# Patient Record
Sex: Male | Born: 1935 | Race: White | Hispanic: No | Marital: Married | Smoking: Former smoker
Health system: Southern US, Community
[De-identification: ages and names within clinical notes are randomized; demographics above are authoritative.]

## PROBLEM LIST (undated history)

## (undated) DIAGNOSIS — I429 Cardiomyopathy, unspecified: Secondary | ICD-10-CM

## (undated) DIAGNOSIS — I4729 Other ventricular tachycardia: Secondary | ICD-10-CM

## (undated) DIAGNOSIS — I10 Essential (primary) hypertension: Secondary | ICD-10-CM

## (undated) DIAGNOSIS — I472 Ventricular tachycardia, unspecified: Secondary | ICD-10-CM

## (undated) DIAGNOSIS — N400 Enlarged prostate without lower urinary tract symptoms: Secondary | ICD-10-CM

## (undated) DIAGNOSIS — M1990 Unspecified osteoarthritis, unspecified site: Secondary | ICD-10-CM

## (undated) DIAGNOSIS — I739 Peripheral vascular disease, unspecified: Secondary | ICD-10-CM

## (undated) HISTORY — PX: PACEMAKER PLACEMENT: SHX43

## (undated) HISTORY — PX: CARDIAC SURGERY: SHX584

---

## 2009-07-17 ENCOUNTER — Ambulatory Visit: Payer: Self-pay | Admitting: Cardiovascular Disease

## 2009-07-18 ENCOUNTER — Inpatient Hospital Stay: Payer: Self-pay | Admitting: Internal Medicine

## 2011-09-10 LAB — BASIC METABOLIC PANEL
Anion Gap: 5 — ABNORMAL LOW (ref 7–16)
BUN: 20 mg/dL — ABNORMAL HIGH (ref 7–18)
Chloride: 108 mmol/L — ABNORMAL HIGH (ref 98–107)
Co2: 29 mmol/L (ref 21–32)
Creatinine: 1.34 mg/dL — ABNORMAL HIGH (ref 0.60–1.30)
EGFR (African American): 57 — ABNORMAL LOW
EGFR (Non-African Amer.): 49 — ABNORMAL LOW
Glucose: 96 mg/dL (ref 65–99)
Potassium: 4.4 mmol/L (ref 3.5–5.1)
Sodium: 142 mmol/L (ref 136–145)

## 2011-09-10 LAB — PROTIME-INR
INR: 1.1
Prothrombin Time: 14.7 secs (ref 11.5–14.7)

## 2011-09-10 LAB — CBC
HCT: 36.7 % — ABNORMAL LOW (ref 40.0–52.0)
MCH: 35.4 pg — ABNORMAL HIGH (ref 26.0–34.0)
MCHC: 34.6 g/dL (ref 32.0–36.0)
MCV: 102 fL — ABNORMAL HIGH (ref 80–100)
Platelet: 238 10*3/uL (ref 150–440)
RBC: 3.58 10*6/uL — ABNORMAL LOW (ref 4.40–5.90)
WBC: 6.8 10*3/uL (ref 3.8–10.6)

## 2011-09-10 LAB — HEPATIC FUNCTION PANEL A (ARMC)
Bilirubin, Direct: <0.1 mg/dL (ref 0.00–0.20)
Bilirubin,Total: 0.4 mg/dL (ref 0.2–1.0)
SGOT(AST): 23 U/L (ref 15–37)

## 2011-09-10 LAB — APTT: Activated PTT: 35.6 secs (ref 23.6–35.9)

## 2011-09-10 LAB — CK TOTAL AND CKMB (NOT AT ARMC): CK-MB: 1.2 ng/mL (ref 0.5–3.6)

## 2011-09-11 ENCOUNTER — Observation Stay: Payer: Self-pay | Admitting: Internal Medicine

## 2011-09-11 LAB — CBC WITH DIFFERENTIAL/PLATELET
Basophil %: 0.5 %
Eosinophil %: 5.8 %
HCT: 32.5 % — ABNORMAL LOW (ref 40.0–52.0)
HGB: 10.9 g/dL — ABNORMAL LOW (ref 13.0–18.0)
Lymphocyte #: 2.2 10*3/uL (ref 1.0–3.6)
MCH: 32.9 pg (ref 26.0–34.0)
MCHC: 33.5 g/dL (ref 32.0–36.0)
MCV: 98 fL (ref 80–100)
Monocyte #: 0.6 x10 3/mm (ref 0.2–1.0)
Monocyte %: 9 %
Neutrophil %: 48.9 %
Platelet: 209 10*3/uL (ref 150–440)
RDW: 13 % (ref 11.5–14.5)
WBC: 6.2 10*3/uL (ref 3.8–10.6)

## 2011-09-11 LAB — URINALYSIS, COMPLETE
Bilirubin,UR: NEGATIVE
Glucose,UR: NEGATIVE mg/dL (ref 0–75)
Ketone: NEGATIVE
Nitrite: NEGATIVE
Protein: NEGATIVE
RBC,UR: 2 /HPF (ref 0–5)
Specific Gravity: 1.013 (ref 1.003–1.030)

## 2011-09-11 LAB — BASIC METABOLIC PANEL
Anion Gap: 4 — ABNORMAL LOW (ref 7–16)
BUN: 19 mg/dL — ABNORMAL HIGH (ref 7–18)
Chloride: 108 mmol/L — ABNORMAL HIGH (ref 98–107)
Co2: 29 mmol/L (ref 21–32)
Creatinine: 1.35 mg/dL — ABNORMAL HIGH (ref 0.60–1.30)
EGFR (African American): 56 — ABNORMAL LOW
EGFR (Non-African Amer.): 49 — ABNORMAL LOW
Osmolality: 286 (ref 275–301)
Potassium: 3.8 mmol/L (ref 3.5–5.1)
Sodium: 141 mmol/L (ref 136–145)

## 2011-09-11 LAB — CK TOTAL AND CKMB (NOT AT ARMC)
CK, Total: 47 U/L (ref 35–232)
CK, Total: 45 U/L (ref 35–232)
CK-MB: 1.1 ng/mL (ref 0.5–3.6)

## 2011-09-11 LAB — LIPID PANEL
Cholesterol: 152 mg/dL (ref 0–200)
HDL Cholesterol: 48 mg/dL (ref 40–60)

## 2011-09-11 LAB — APTT: Activated PTT: 127.2 secs — ABNORMAL HIGH (ref 23.6–35.9)

## 2011-09-11 LAB — TROPONIN I
Troponin-I: <0.02 ng/mL
Troponin-I: <0.02 ng/mL

## 2013-03-14 ENCOUNTER — Emergency Department: Payer: Self-pay | Admitting: Emergency Medicine

## 2013-03-14 LAB — CBC
MCH: 33.3 pg (ref 26.0–34.0)
RDW: 12.8 % (ref 11.5–14.5)
WBC: 8.1 10*3/uL (ref 3.8–10.6)

## 2013-03-14 LAB — BASIC METABOLIC PANEL
Anion Gap: 5 — ABNORMAL LOW (ref 7–16)
BUN: 35 mg/dL — ABNORMAL HIGH (ref 7–18)
Calcium, Total: 10 mg/dL (ref 8.5–10.1)
Co2: 29 mmol/L (ref 21–32)
Creatinine: 1.74 mg/dL — ABNORMAL HIGH (ref 0.60–1.30)
EGFR (African American): 41 — ABNORMAL LOW
EGFR (Non-African Amer.): 35 — ABNORMAL LOW

## 2013-03-14 LAB — PROTIME-INR: INR: 2.5

## 2013-03-14 LAB — TROPONIN I: Troponin-I: <0.02 ng/mL

## 2014-03-17 ENCOUNTER — Emergency Department: Payer: Self-pay | Admitting: Internal Medicine

## 2014-03-17 LAB — COMPREHENSIVE METABOLIC PANEL
ALBUMIN: 3.7 g/dL (ref 3.4–5.0)
ALT: 24 U/L
AST: 32 U/L (ref 15–37)
Alkaline Phosphatase: 81 U/L
Anion Gap: 5 — ABNORMAL LOW (ref 7–16)
BILIRUBIN TOTAL: 0.3 mg/dL (ref 0.2–1.0)
BUN: 35 mg/dL — ABNORMAL HIGH (ref 7–18)
CHLORIDE: 104 mmol/L (ref 98–107)
Calcium, Total: 9.5 mg/dL (ref 8.5–10.1)
Co2: 32 mmol/L (ref 21–32)
Creatinine: 1.54 mg/dL — ABNORMAL HIGH (ref 0.60–1.30)
EGFR (African American): 56 — ABNORMAL LOW
GFR CALC NON AF AMER: 46 — AB
GLUCOSE: 129 mg/dL — AB (ref 65–99)
Osmolality: 291 (ref 275–301)
POTASSIUM: 4.1 mmol/L (ref 3.5–5.1)
SODIUM: 141 mmol/L (ref 136–145)
Total Protein: 7.1 g/dL (ref 6.4–8.2)

## 2014-03-17 LAB — LIPASE, BLOOD: Lipase: 122 U/L (ref 73–393)

## 2014-03-17 LAB — CBC WITH DIFFERENTIAL/PLATELET
BASOS ABS: 0 10*3/uL (ref 0.0–0.1)
BASOS PCT: 0.5 %
EOS PCT: 5.1 %
Eosinophil #: 0.5 10*3/uL (ref 0.0–0.7)
HCT: 37.3 % — ABNORMAL LOW (ref 40.0–52.0)
HGB: 12.2 g/dL — ABNORMAL LOW (ref 13.0–18.0)
LYMPHS PCT: 26.2 %
Lymphocyte #: 2.6 10*3/uL (ref 1.0–3.6)
MCH: 32.8 pg (ref 26.0–34.0)
MCHC: 32.6 g/dL (ref 32.0–36.0)
MCV: 101 fL — ABNORMAL HIGH (ref 80–100)
MONO ABS: 0.9 x10 3/mm (ref 0.2–1.0)
MONOS PCT: 9.4 %
NEUTROS ABS: 5.7 10*3/uL (ref 1.4–6.5)
Neutrophil %: 58.8 %
Platelet: 218 10*3/uL (ref 150–440)
RBC: 3.71 10*6/uL — ABNORMAL LOW (ref 4.40–5.90)
RDW: 13 % (ref 11.5–14.5)
WBC: 9.7 10*3/uL (ref 3.8–10.6)

## 2014-07-02 ENCOUNTER — Emergency Department: Payer: Self-pay | Admitting: Emergency Medicine
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
## 2014-08-28 NOTE — Consult Note (Signed)
PATIENT NAME:  Daniel Huff, HOLZMAN MR#:  [id number] DATE OF BIRTH:  April 02, 1927  DATE OF CONSULTATION:  09/10/2011  REFERRING PHYSICIAN:  Dr. Inez Catalina  CONSULTING PHYSICIAN:  Corey Skains, MD  REASON FOR CONSULTATION: Aortic valve stenosis status post valvular replacement, hypertension, hyperlipidemia, coronary artery disease, abnormal EKG with chest pain. He is complaining of chest pain.   HISTORY OF PRESENT ILLNESS: This is an 81 year old male with known aortic valve disease, coronary artery disease status post aortic valve replacement twice, the second time he had mechanical St. Jude valve replacement at that time. Things have gone fairly well without evidence of significant new problems with his valve. The patient has had hospitalization for chest discomfort but no evidence of myocardial infarction. The patient now has atypical left upper chest discomfort radiating into his back not associated with shortness of breath. EKG shows normal sinus rhythm, left bundle branch block. This is unchanged from before. Troponin, CK-MB are within normal limits. The patient also has had no current evidence of congestive heart failure. The patient has had anticoagulation in the past but recently had a GI bleed and significant problems with compliance with his medications and therefore has had some difficulty taking his medications therefore no anticoagulation has been used.   REVIEW OF SYSTEMS: Remainder of review of systems cannot be assessed due to difficulty and dementia.   PAST MEDICAL HISTORY:  1. Aortic valve stenosis status post valve replacement.  2. Hypertension.  3. Hyperlipidemia.  4. Coronary artery disease.   FAMILY HISTORY: No family members with early onset of cardiovascular disease.   SOCIAL HISTORY: Currently denies alcohol or tobacco use.   ALLERGIES: He has no known drug allergies.   CURRENT MEDICATIONS: As listed.   PHYSICAL EXAMINATION:  VITAL SIGNS: Blood pressure 126/68 bilateral,  heart rate 72 upright, reclining, and regular.   GENERAL: He is a well appearing male in no acute distress.   HEENT: No icterus, thyromegaly, ulcers, hemorrhage, or xanthelasma.   CARDIOVASCULAR: Regular rate and rhythm with normal S1, normal prosthetic click with a 2 to 3/6 right upper extremity border murmur and radiating throughout. Point of maximal impulse is diffuse. Carotid upstroke normal without bruit. Jugular venous pressure normal.   LUNGS: Lungs have few basilar crackles with normal respirations.   ABDOMEN: Soft, nontender, without hepatosplenomegaly or masses. Abdominal aorta is normal size without bruit.   EXTREMITIES: 2+ radial, femoral, dorsal pedal pulses with no lower extremity edema, cyanosis, clubbing, ulcers.   NEUROLOGIC: He is oriented to time, place, and person with normal mood and affect.   ASSESSMENT: 81 year old male with aortic valve stenosis, hypertension, hyperlipidemia, coronary artery disease with atypical chest discomfort without evidence of acute myocardial infarction or elevated troponin but abnormal EKG, stable at this time.   RECOMMENDATIONS:  1. Continue serial ECG and enzymes to assess for possible myocardial infarction.  2. Continue hypertension control with current medical regimen.  3. No anticoagulation at this time due to significant previous GI bleed and further concerns of compliance and fall risk as well as dementia.  4. Ambulate and follow for any further significant symptoms.  5. No further cardiac diagnostics necessary at this time.   ____________________________ Corey Skains, MD bjk:cms D: 09/15/2011 07:42:20 ET T: 09/16/2011 08:35:47 ET  JOB#: [id number] cc: Corey Skains, MD, <Dictator> Corey Skains MD ELECTRONICALLY SIGNED 09/19/2011 8:50

## 2014-08-28 NOTE — Discharge Summary (Signed)
PATIENT NAME:  Daniel Huff, Daniel Huff MR#:  [id number] DATE OF BIRTH:  January 24, 1927  DATE OF ADMISSION:  09/11/2011 DATE OF DISCHARGE:  09/13/2011  ADMITTING PHYSICIAN: Daniel Ligas, MD   DISCHARGING PHYSICIAN: Daniel Lighter, MD   PRIMARY CARE MD: [place]: Cardiology consultation by Dr. Serafina Huff   DISCHARGE DIAGNOSES:  1. Chest pain, likely noncardiac, may be musculoskeletal. Cardiac enzymes negative.  2. Coronary artery disease, status post bypass graft surgery.  3. Ischemic cardiomyopathy.  4. Congestive heart failure with systolic dysfunction, EF of 20%.  5. Aortic valve replacement with mechanical valve status post GI bleed history, currently off of Coumadin.  6. Chronic anemia.  7. Hypertension.  8. Mild dementia.  9. Acute renal failure on admission, resolving.  10. Benign prostatic hypertrophy.   DISCHARGE HOME MEDICATIONS:   1. Finasteride 5 mg p.o. daily.  2. Cyanocobalamin 2000 mcg p.o. daily.  3. Allopurinol 100 mg p.o. daily.  4. Cholecalciferol 2000 international units daily. 5. Simvastatin 10 mg p.o. daily.  6. Metoprolol 25 mg p.o. daily.  7. Sodium chloride 5% ophthalmic ointment to right eye twice daily.  8. Flomax 0.4 mg p.o. daily.  9. Imdur 30 mg p.o. daily.  10. Lisinopril 2.5 mg p.o. daily.   DISCHARGE DIET: Low sodium diet.   DISCHARGE ACTIVITY: As tolerated.    FOLLOW-UP INSTRUCTIONS:  1. Follow-up with primary cardiologist at [facility] in 1 to 2 weeks.  2. PCP follow-up at [facility] in one week.  3. Physical therapy.  4. PPD has been placed on right forearm on 09/13/2011 which needs to be read 48 to 72 hours from now.  LABS AND IMAGING STUDIES: WBC 6.2, hemoglobin 10.9, hematocrit 32.5, platelet count 209. Urinalysis with 2+ leukocyte esterase, few WBCs but no bacteria. Cardiac enzymes remained negative.   Echo Doppler showing severely reduced LV ejection fraction 20%, moderate mitral regurgitation, and stable  appearing AV prosthesis is present. There is severe hypokinesis and akinesis of left ventricular septum inferior and anterior wall.   Sodium 141, potassium 3.8, chloride 108, bicarb 29, BUN 19, creatinine 1.3, glucose 146, calcium 8.4. LDL 94, HDL 48, triglycerides 48, total cholesterol 152. Chest x-ray on admission showing no acute cardiopulmonary disease. Vitamin B12 is 377.   BRIEF HOSPITAL COURSE: [name] is an 81 year old gentleman with past medical history significant for coronary artery disease status post bypass graft surgery, mechanical aortic valve replacement, hypertension, and benign prostatic hypertrophy who has been living at home with poor social situation and poor family support who was brought to the ED for chest pain. The patient says that he has had chest pain intermittently several times. It could be either muscular or reflux problem but probably not cardiac related. No family was ready to take the patient home so case managers have been working with the patient.  1. Chest pain, likely noncardiac in nature. He was initially admitted for unstable angina and was monitored on telemetry under observation. He was ruled out. No further tests were performed. Echo showed akinesis and hypokinesis of inferior wall septum and anterior wall which is probably chronic. The patient has requested to be transferred to [facility] but the medical center was on diversion. He was not having any further chest pain so medical management was recommended while in the hospital. He is being discharged on metoprolol, lisinopril, and also Imdur. The patient is allergic to aspirin.  2. Coronary artery disease status post bypass graft surgery. As mentioned above,  he is on medical management currently, probably might benefit from stress test as an outpatient. He was on heparin drip for 48 hours while in the hospital and is currently chest pain free. 3. Mechanical aortic valve replacement. He was initially started on  heparin drip but it seems like he was taken off of Coumadin because of GI bleed history recently. The current echo does not show any wall thrombosis and Cardiology has seen the patient and did not recommend any further anticoagulation. This was explained to the patient and he is supposed to follow-up with his primary cardiologist at [facility].  4. Acute renal failure likely from volume depletion, prerenal in nature. He was given gentle IV hydration, however, because of his CHF more fluid was not given.  5. Mild cognitive impairment, early dementia. He cannot go back home so he will be discharged to an assisted living facility.  6. Benign prostatic hypertrophy. He is on finasteride and Flomax.   His course has been otherwise uneventful in the hospital.   DISCHARGE CONDITION: Stable.   DISCHARGE DISPOSITION: Assisted living facility.   TIME SPENT ON DISCHARGE: 40 minutes.   ____________________________ Daniel Lighter, MD rk:drc D: 09/13/2011 14:18:35 ET T: 09/16/2011 10:31:42 ET JOB#: [id number]  cc: Daniel Lighter, MD, <Dictator> [place] MD ELECTRONICALLY SIGNED 09/18/2011 14:21

## 2014-12-26 ENCOUNTER — Emergency Department: Payer: Medicare Other

## 2014-12-26 ENCOUNTER — Encounter: Payer: Self-pay | Admitting: Emergency Medicine

## 2014-12-26 ENCOUNTER — Emergency Department
Admission: EM | Admit: 2014-12-26 | Discharge: 2014-12-27 | Disposition: A | Discharge status: Other Acute Inpatient Hospital | Payer: Medicare Other | Attending: Emergency Medicine | Admitting: Emergency Medicine

## 2014-12-26 DIAGNOSIS — W1839XA Other fall on same level, initial encounter: Secondary | ICD-10-CM | POA: Diagnosis not present

## 2014-12-26 DIAGNOSIS — I10 Essential (primary) hypertension: Secondary | ICD-10-CM | POA: Diagnosis not present

## 2014-12-26 DIAGNOSIS — Y92009 Unspecified place in unspecified non-institutional (private) residence as the place of occurrence of the external cause: Secondary | ICD-10-CM | POA: Diagnosis not present

## 2014-12-26 DIAGNOSIS — S329XXA Fracture of unspecified parts of lumbosacral spine and pelvis, initial encounter for closed fracture: Secondary | ICD-10-CM

## 2014-12-26 DIAGNOSIS — Y9389 Activity, other specified: Secondary | ICD-10-CM | POA: Insufficient documentation

## 2014-12-26 DIAGNOSIS — S4991XA Unspecified injury of right shoulder and upper arm, initial encounter: Secondary | ICD-10-CM | POA: Diagnosis not present

## 2014-12-26 DIAGNOSIS — S3289XA Fracture of other parts of pelvis, initial encounter for closed fracture: Secondary | ICD-10-CM | POA: Diagnosis not present

## 2014-12-26 DIAGNOSIS — Y998 Other external cause status: Secondary | ICD-10-CM | POA: Diagnosis not present

## 2014-12-26 DIAGNOSIS — Z87891 Personal history of nicotine dependence: Secondary | ICD-10-CM | POA: Insufficient documentation

## 2014-12-26 DIAGNOSIS — S299XXA Unspecified injury of thorax, initial encounter: Secondary | ICD-10-CM | POA: Insufficient documentation

## 2014-12-26 DIAGNOSIS — S79911A Unspecified injury of right hip, initial encounter: Secondary | ICD-10-CM | POA: Diagnosis present

## 2014-12-26 HISTORY — DX: Peripheral vascular disease, unspecified: I73.9

## 2014-12-26 HISTORY — DX: Benign prostatic hyperplasia without lower urinary tract symptoms: N40.0

## 2014-12-26 HISTORY — DX: Other ventricular tachycardia: I47.29

## 2014-12-26 HISTORY — DX: Ventricular tachycardia: I47.2

## 2014-12-26 HISTORY — DX: Essential (primary) hypertension: I10

## 2014-12-26 HISTORY — DX: Ventricular tachycardia, unspecified: I47.20

## 2014-12-26 HISTORY — DX: Unspecified osteoarthritis, unspecified site: M19.90

## 2014-12-26 HISTORY — DX: Cardiomyopathy, unspecified: I42.9

## 2014-12-26 LAB — CBC WITH DIFFERENTIAL/PLATELET
Basophils Absolute: 0 10*3/uL (ref 0–0.1)
Basophils Relative: 0 %
Eosinophils Absolute: 0.2 10*3/uL (ref 0–0.7)
Eosinophils Relative: 1 %
HEMATOCRIT: 38.8 % — AB (ref 40.0–52.0)
HEMOGLOBIN: 12.6 g/dL — AB (ref 13.0–18.0)
LYMPHS ABS: 1.7 10*3/uL (ref 1.0–3.6)
LYMPHS PCT: 10 %
MCH: 31.6 pg (ref 26.0–34.0)
MCHC: 32.5 g/dL (ref 32.0–36.0)
MCV: 97.2 fL (ref 80.0–100.0)
Monocytes Absolute: 1.1 10*3/uL — ABNORMAL HIGH (ref 0.2–1.0)
Monocytes Relative: 7 %
NEUTROS ABS: 13.6 10*3/uL — AB (ref 1.4–6.5)
NEUTROS PCT: 82 %
Platelets: 240 10*3/uL (ref 150–440)
RBC: 3.99 MIL/uL — AB (ref 4.40–5.90)
RDW: 13.4 % (ref 11.5–14.5)
WBC: 16.6 10*3/uL — AB (ref 3.8–10.6)

## 2014-12-26 LAB — COMPREHENSIVE METABOLIC PANEL
ALK PHOS: 85 U/L (ref 38–126)
ALT: 26 U/L (ref 17–63)
ANION GAP: 8 (ref 5–15)
AST: 35 U/L (ref 15–41)
Albumin: 4.5 g/dL (ref 3.5–5.0)
BUN: 33 mg/dL — ABNORMAL HIGH (ref 6–20)
CALCIUM: 9.8 mg/dL (ref 8.9–10.3)
CO2: 29 mmol/L (ref 22–32)
Chloride: 101 mmol/L (ref 101–111)
Creatinine, Ser: 1.45 mg/dL — ABNORMAL HIGH (ref 0.61–1.24)
GFR, EST AFRICAN AMERICAN: 49 mL/min — AB (ref >60)
GFR, EST NON AFRICAN AMERICAN: 42 mL/min — AB (ref >60)
Glucose, Bld: 129 mg/dL — ABNORMAL HIGH (ref 65–99)
Potassium: 4.5 mmol/L (ref 3.5–5.1)
SODIUM: 138 mmol/L (ref 135–145)
Total Bilirubin: 0.4 mg/dL (ref 0.3–1.2)
Total Protein: 7.6 g/dL (ref 6.5–8.1)

## 2014-12-26 LAB — PROTIME-INR
INR: 2.11
PROTHROMBIN TIME: 23.8 s — AB (ref 11.4–15.0)

## 2014-12-26 MED ORDER — TRAMADOL HCL 50 MG PO TABS
50.0000 mg | ORAL_TABLET | Freq: Once | ORAL | Status: AC
  Administered 2014-12-26: 50 mg via ORAL
  Filled 2014-12-26: qty 1

## 2014-12-26 MED ORDER — MORPHINE SULFATE (PF) 4 MG/ML IV SOLN
4.0000 mg | Freq: Once | INTRAVENOUS | Status: AC
  Administered 2014-12-26: 4 mg via INTRAVENOUS
  Filled 2014-12-26: qty 1

## 2014-12-26 MED ORDER — ONDANSETRON HCL 4 MG/2ML IJ SOLN
4.0000 mg | Freq: Once | INTRAMUSCULAR | Status: AC
  Administered 2014-12-26: 4 mg via INTRAVENOUS
  Filled 2014-12-26: qty 2

## 2014-12-26 NOTE — ED Provider Notes (Signed)
[facility] wanted to go to [facility] [place] since [facility] does not need [facility] for [facility]. They would be happy to see him if he needed rehabilitation. I was not sure if the family care home could take care of him. I therefore call the family care home. Family care home personnel wanted to talk to their supervisor. I talked that sometime later the supervisor called back since he needed to talk to social services because social services had the agent. He said he felt it would be better for the patient if he could get some rehabilitation. He said the patient had been to the [place] several times in the system over there very well under the people. At this point I am still waiting for the supervisor to have talked to the social worker and call me back with the disposition. If the decision is to go to rehabilitation A will call the [place] doctor who gave me his direct line notify him of this. Otherwise he will go back to the care home if they can manage him.  Nena Polio, MD 12/26/14 [id number]

## 2014-12-26 NOTE — ED Notes (Signed)
Patient gone for testing

## 2014-12-26 NOTE — ED Provider Notes (Signed)
Time Seen: Approximately 1520  I have reviewed the triage notes  Chief Complaint: Fall   History of Present Illness: Daniel Huff is a 81 y.o. male who presents after a non-syncopal fall at a local family care home. Patient was transported here by EMS uneventfully. Review of records shows the patient fell from a standing height while using his walker. He landed primarily on his right side. He is complaining of right shoulder and right chest wall and hip pain. He denies any shortness of breath. There is no record in the patient denies any head trauma. He denies any nausea, vomiting. There is no record of a fever.   Past Medical History  Diagnosis Date  . PVD (peripheral vascular disease)   . Hypertension   . DJD (degenerative joint disease)   . Ventricular tachycardia (paroxysmal)   . BPH (benign prostatic hyperplasia)   . Cardiomyopathy     There are no active problems to display for this patient.   Past Surgical History  Procedure Laterality Date  . Pacemaker placement    . Cardiac surgery      Past Surgical History  Procedure Laterality Date  . Pacemaker placement    . Cardiac surgery      No current outpatient prescriptions on file.  Allergies:  Aspirin  Family History: No family history on file.  Social History: Social History  Substance Use Topics  . Smoking status: Former Research scientist (life sciences)  . Smokeless tobacco: None  . Alcohol Use: None     Review of Systems:   10 point review of systems was performed and was otherwise negative:  Constitutional: No fever Eyes: No visual disturbances ENT: No sore throat, ear pain Cardiac: No chest pain Respiratory: No shortness of breath, wheezing, or stridor Abdomen: No abdominal pain, no vomiting, No diarrhea Endocrine: No weight loss, No night sweats Extremities: No peripheral edema, cyanosis Skin: No rashes, easy bruising Neurologic: No focal weakness, trouble with speech or swollowing Urologic: No  dysuria, Hematuria, or urinary frequency Patient mostly complains of right-sided chest wall discomfort and right shoulder pain.  Physical Exam:  ED Triage Vitals  Enc Vitals Group     BP 12/26/14 1300 179/112 mmHg     Pulse Rate 12/26/14 1300 104     Resp 12/26/14 1300 18     Temp 12/26/14 1300 98.2 F (36.8 C)     Temp Source 12/26/14 1300 Oral     SpO2 12/26/14 1300 94 %     Weight 12/26/14 1300 140 lb (63.504 kg)     Height 12/26/14 1300 '5\' 7"'$  (1.702 m)     Head Cir --      Peak Flow --      Pain Score 12/26/14 1302 9     Pain Loc --      Pain Edu? --      Excl. in [place]? --     General: Awake , Alert , and Oriented times 3; GCS 15 Head: Normal cephalic , atraumatic Eyes: Pupils equal , round, reactive to light Nose/Throat: No nasal drainage, patent upper airway without erythema or exudate.  Neck: Supple, Full range of motion, No anterior adenopathy or palpable thyroid masses Lungs: Clear to ascultation without wheezes , rhonchi, or rales Heart: Regular rate, regular rhythm without murmurs , gallops , or rubs Abdomen: Soft, non tender without rebound, guarding , or rigidity; bowel sounds positive and symmetric in all 4 quadrants. No organomegaly .        Extremities:  Patient has pain with flexion and rotation of the hip on the right side without any shortening. All extremities are neurovascularly intact. He also has some tenderness over the right lower chest wall without any crepitus and also over the right shoulder region without any musculoskeletal abnormalities. Neurologic: normal ambulation, Motor symmetric without deficits, sensory intact Skin: warm, dry, no rashes  Blood pressure 181/113, pulse 92, temperature 98.2 F (36.8 C), temperature source Oral, resp. rate 18, height '5\' 7"'$  (1.702 m), weight 140 lb (63.504 kg), SpO2 98 %.  Labs:   All laboratory work was reviewed including any pertinent negatives or positives listed below:  Labs Reviewed  CBC WITH  DIFFERENTIAL/PLATELET - Abnormal; Notable for the following:    WBC 16.6 (*)    RBC 3.99 (*)    Hemoglobin 12.6 (*)    HCT 38.8 (*)    Neutro Abs 13.6 (*)    Monocytes Absolute 1.1 (*)    All other components within normal limits  COMPREHENSIVE METABOLIC PANEL - Abnormal; Notable for the following:    Glucose, Bld 129 (*)    BUN 33 (*)    Creatinine, Ser 1.45 (*)    GFR calc non Af Amer 42 (*)    GFR calc Af Amer 49 (*)    All other components within normal limits  PROTIME-INR - Abnormal; Notable for the following:    Prothrombin Time 23.8 (*)    All other components within normal limits   laboratory work was reviewed and shows no significant abnormalities other than a slightly elevated white blood cell count and an afebrile patient.  EKG: ED ECG REPORT I, Daymon Larsen, the attending physician, personally viewed and interpreted this ECG.  Date: 12/26/2014 EKG Time: 1305 Rate: 90 Rhythm: AV sequential pacemaker with frequent PVCs QRS Axis: normal Intervals: normal ST/T Wave abnormalities: normal Conduction Disutrbances: none Narrative Interpretation: unremarkable    Radiology:  I personally reviewed the radiologic studies   Procedures: None  EXAM: RIGHT SHOULDER - 2+ VIEW  COMPARISON: None.  FINDINGS: The right shoulder is located. There is no evidence for an acute fracture. Cardiac pacer wires and median sternotomy wires are noted. Degenerative changes in the lower cervical spine. Visualized right ribs are intact.  IMPRESSION: No acute abnormality.EXAM: DG HIP (WITH OR WITHOUT PELVIS) 2-3V RIGHT  COMPARISON: None.  FINDINGS: There is severe osteopenia. There is no acute fracture or dislocation. Subtle subchondral serpiginous area of sclerosis in the superior right femoral head which may reflect an area of avascular necrosis. There is a left total hip arthroplasty. There is periprostatic lucency in the left acetabulum and left lesser and greater  trochanters which may reflect more focal osteopenia versus particle disease.  There are degenerative changes of bilateral SI joints and lower lumbar spine.  There is peripheral vascular atherosclerotic disease.  IMPRESSION: No acute osseous injury of the right hip. Given the patient's age and osteopenia, if there is persistent clinical concern for an occult hip fracture, a MRI of the hip is recommended for increased sensitivity.  EXAM: CT OF THE RIGHT HIP WITHOUT CONTRAST  TECHNIQUE: Multidetector CT imaging of the right hip was performed according to the standard protocol. Multiplanar CT image reconstructions were also generated.  COMPARISON: 12/26/2014 radiograph  FINDINGS: Bony deformity noted along the anterior column of the right acetabulum, with cortical discontinuity on image 27 of series 6 in this vicinity.  On image 51 of series 6, there appears to be focal cortical irregularity in the right inferior  pubic ramus which is suspicious for a nondisplaced fracture.  Old deformity in the left inferior pubic ramus, likely from a remote fracture.  Chronically fragmented osteophytes from the right acetabular rim. Sigmoid diverticulosis. Bone island noted in the right posterior acetabular column.  IMPRESSION: 1. Very subtle bony discontinuity is in the right inferior pubic ramus and anterior column of the right acetabulum along the base of the superior pubic ramus, suspicious for nondisplaced acute fractures. This type of fracture pattern border often accompany fractures elsewhere in the bony pelvis, for example in the sacrum, but only a small portion of sacrum is included on today's hip CT. 2. Sigmoid colon diverticulosis. 3. Deformity of the left inferior pubic ramus is thought to likely be old, due to a remote fracture.  Critical Care: None    ED Course: Patient was given Ultram for pain which in review of his medical chart apparently takes for intermittent  discomfort. Patient was attempted to be ambulated at the bedside and had difficulty Bearing weight on his right leg. He was able to stand briefly. Repeat blood pressure after receiving pain control was 167/58   Assessment: Pelvic fracture                        Right shoulder contusion                        Chest wall contusion     Final Clinical Impression: Pelvic fracture Final diagnoses:  None     Plan: The patient has requested to be transferred to the [facility]. He may not need surgery but likely rehabilitation and currently has difficulty maintaining any weightbearing and/or ambulation. The [facility] and answer will be pursued. I currently have a call in for orthopedic surgery at the [facility] in [place], [place] 12/27/14 [phone]

## 2014-12-26 NOTE — ED Notes (Signed)
Waiting to hear about placement of patient. Patient is aware of plan.

## 2014-12-26 NOTE — ED Notes (Signed)
Ems pt from [facility] , fell from standing while using a walker, landing on the right side. Pt complaining of right rib pain worse on palpation, and pain to right inner leg , no shortening or rotation noted , pain worsens on palpation. Alert x3 ,

## 2014-12-26 NOTE — ED Notes (Signed)
Patient was able to stand with two-person assist. Patient could not maintain an upright posture. Patient appeared unsteady, c/o pain with any movement.

## 2014-12-26 NOTE — ED Notes (Signed)
Patient ate one container of applesauce and had 3 sips of ginger ale and stated that is all he wanted. Patient did cough after 3rd sip of ginger ale.

## 2014-12-26 NOTE — ED Notes (Signed)
MD at bedside., Dr.Quigley

## 2014-12-26 NOTE — ED Notes (Signed)
Patient's brief was wet. Patient was cleaned and new diaper is in place.

## 2014-12-27 NOTE — Care Management Note (Signed)
Case Management Note  Patient Details  Name: Muhsin Doris MRN: [id number] Date of Birth: 05-01-1927  Subjective/Objective:   DSS Gilmer Mor ahs called back to donfirm she has told the [place] we have her verbal consent to transfer the pt. Dr Jimmye Norman and unit secretary advised of same.Pt. To be an ER to ER  Transfer.                 Action/Plan:   Expected Discharge Date:                  Expected Discharge Plan:     In-House Referral:     Discharge planning Services     Post Acute Care Choice:    Choice offered to:     DME Arranged:    DME Agency:     HH Arranged:    [place] Agency:     Status of Service:     Medicare Important Message Given:    Date Medicare IM Given:    Medicare IM give by:    Date Additional Medicare IM Given:    Additional Medicare Important Message give by:     If discussed at [place] of Stay Meetings, dates discussed:    Additional Comments:  Beau Fanny, RN 12/27/2014, 9:51 AM

## 2014-12-27 NOTE — Care Management Note (Signed)
Case Management Note  Patient Details  Name: Bekim Werntz MRN: [id number] Date of Birth: March 14, 1927  Subjective/Objective:  Called and left VM with [place] [phone]. I explained that we are waiting for her to approve the pts. Transfer to [facility] for rehab, and left my landline info.   Awaiting callback.                Action/Plan:   Expected Discharge Date:                  Expected Discharge Plan:     In-House Referral:     Discharge planning Services     Post Acute Care Choice:    Choice offered to:     DME Arranged:    DME Agency:     HH Arranged:    [place] Agency:     Status of Service:     Medicare Important Message Given:    Date Medicare IM Given:    Medicare IM give by:    Date Additional Medicare IM Given:    Additional Medicare Important Message give by:     If discussed at [place] of Stay Meetings, dates discussed:    Additional Comments:  Beau Fanny, RN 12/27/2014, 9:15 AM

## 2014-12-27 NOTE — Care Management Note (Signed)
Case Management Note  Patient Details  Name: Daniel Huff MRN: [id number] Date of Birth: 13-Oct-1926  Subjective/Objective:     [facility]  Action/Plan:   Expected Discharge Date:                  Expected Discharge Plan:     In-House Referral:     Discharge planning Services     Post Acute Care Choice:    Choice offered to:     DME Arranged:    DME Agency:     HH Arranged:    [place] Agency:     Status of Service:     Medicare Important Message Given:    Date Medicare IM Given:    Medicare IM give by:    Date Additional Medicare IM Given:    Additional Medicare Important Message give by:     If discussed at [place] of Stay Meetings, dates discussed:    Additional Comments:  Beau Fanny, RN 12/27/2014, 10:35 AM

## 2014-12-27 NOTE — ED Notes (Signed)
Attempted to ambulate pt per MD orders; pt was only able to stand very briefly at bedside before c/o severe right-sided hip pain. Pt unable to bear weight on right leg and/or ambulate; MD made aware.

## 2014-12-27 NOTE — Care Management Note (Signed)
Case Management Note  Patient Details  Name: Keegen Heffern MRN: [id number] Date of Birth: 1926-06-19  Subjective/Objective:  Spoke to Gilmer Mor, at [facility]; she will call the [facility] transfer   Center to see if they can take averbal, as she feels it is okay to send the pt. To the [facility] for rehab consideration. I provided her with the main ph. [phone]. She will call us back.                Action/Plan:   Expected Discharge Date:                  Expected Discharge Plan:     In-House Referral:     Discharge planning Services     Post Acute Care Choice:    Choice offered to:     DME Arranged:    DME Agency:     HH Arranged:    [place] Agency:     Status of Service:     Medicare Important Message Given:    Date Medicare IM Given:    Medicare IM give by:    Date Additional Medicare IM Given:    Additional Medicare Important Message give by:     If discussed at [place] of Stay Meetings, dates discussed:    Additional Comments:  Beau Fanny, RN 12/27/2014, 9:28 AM

## 2015-05-31 ENCOUNTER — Emergency Department: Payer: Medicare Other

## 2015-05-31 ENCOUNTER — Inpatient Hospital Stay
Admission: EM | Admit: 2015-05-31 | Discharge: 2015-06-06 | DRG: 552 | Disposition: A | Discharge status: Skilled Nursing Facility | Payer: Medicare Other | Attending: Internal Medicine | Admitting: Internal Medicine

## 2015-05-31 ENCOUNTER — Encounter: Payer: Self-pay | Admitting: Emergency Medicine

## 2015-05-31 DIAGNOSIS — Z87891 Personal history of nicotine dependence: Secondary | ICD-10-CM | POA: Diagnosis not present

## 2015-05-31 DIAGNOSIS — Z952 Presence of prosthetic heart valve: Secondary | ICD-10-CM | POA: Diagnosis not present

## 2015-05-31 DIAGNOSIS — Z950 Presence of cardiac pacemaker: Secondary | ICD-10-CM | POA: Diagnosis not present

## 2015-05-31 DIAGNOSIS — S2249XA Multiple fractures of ribs, unspecified side, initial encounter for closed fracture: Secondary | ICD-10-CM | POA: Diagnosis present

## 2015-05-31 DIAGNOSIS — N179 Acute kidney failure, unspecified: Secondary | ICD-10-CM | POA: Diagnosis present

## 2015-05-31 DIAGNOSIS — R748 Abnormal levels of other serum enzymes: Secondary | ICD-10-CM | POA: Diagnosis present

## 2015-05-31 DIAGNOSIS — W010XXA Fall on same level from slipping, tripping and stumbling without subsequent striking against object, initial encounter: Secondary | ICD-10-CM | POA: Diagnosis present

## 2015-05-31 DIAGNOSIS — D509 Iron deficiency anemia, unspecified: Secondary | ICD-10-CM | POA: Diagnosis present

## 2015-05-31 DIAGNOSIS — E860 Dehydration: Secondary | ICD-10-CM | POA: Diagnosis present

## 2015-05-31 DIAGNOSIS — S22089A Unspecified fracture of T11-T12 vertebra, initial encounter for closed fracture: Secondary | ICD-10-CM | POA: Diagnosis not present

## 2015-05-31 DIAGNOSIS — R918 Other nonspecific abnormal finding of lung field: Secondary | ICD-10-CM | POA: Diagnosis present

## 2015-05-31 DIAGNOSIS — Z8249 Family history of ischemic heart disease and other diseases of the circulatory system: Secondary | ICD-10-CM

## 2015-05-31 DIAGNOSIS — S32040A Wedge compression fracture of fourth lumbar vertebra, initial encounter for closed fracture: Secondary | ICD-10-CM | POA: Diagnosis present

## 2015-05-31 DIAGNOSIS — S22080A Wedge compression fracture of T11-T12 vertebra, initial encounter for closed fracture: Secondary | ICD-10-CM | POA: Diagnosis present

## 2015-05-31 DIAGNOSIS — M8580 Other specified disorders of bone density and structure, unspecified site: Secondary | ICD-10-CM | POA: Diagnosis present

## 2015-05-31 DIAGNOSIS — Y92009 Unspecified place in unspecified non-institutional (private) residence as the place of occurrence of the external cause: Secondary | ICD-10-CM | POA: Diagnosis not present

## 2015-05-31 DIAGNOSIS — I2510 Atherosclerotic heart disease of native coronary artery without angina pectoris: Secondary | ICD-10-CM | POA: Diagnosis present

## 2015-05-31 DIAGNOSIS — I10 Essential (primary) hypertension: Secondary | ICD-10-CM | POA: Diagnosis present

## 2015-05-31 DIAGNOSIS — W19XXXA Unspecified fall, initial encounter: Secondary | ICD-10-CM

## 2015-05-31 DIAGNOSIS — M1990 Unspecified osteoarthritis, unspecified site: Secondary | ICD-10-CM | POA: Diagnosis present

## 2015-05-31 DIAGNOSIS — R296 Repeated falls: Secondary | ICD-10-CM | POA: Diagnosis present

## 2015-05-31 DIAGNOSIS — Z888 Allergy status to other drugs, medicaments and biological substances status: Secondary | ICD-10-CM | POA: Diagnosis not present

## 2015-05-31 DIAGNOSIS — E870 Hyperosmolality and hypernatremia: Secondary | ICD-10-CM | POA: Diagnosis present

## 2015-05-31 DIAGNOSIS — S22000A Wedge compression fracture of unspecified thoracic vertebra, initial encounter for closed fracture: Secondary | ICD-10-CM

## 2015-05-31 DIAGNOSIS — K5900 Constipation, unspecified: Secondary | ICD-10-CM | POA: Diagnosis present

## 2015-05-31 DIAGNOSIS — I739 Peripheral vascular disease, unspecified: Secondary | ICD-10-CM | POA: Diagnosis present

## 2015-05-31 DIAGNOSIS — Z7901 Long term (current) use of anticoagulants: Secondary | ICD-10-CM

## 2015-05-31 DIAGNOSIS — E43 Unspecified severe protein-calorie malnutrition: Secondary | ICD-10-CM | POA: Insufficient documentation

## 2015-05-31 DIAGNOSIS — I255 Ischemic cardiomyopathy: Secondary | ICD-10-CM | POA: Diagnosis present

## 2015-05-31 DIAGNOSIS — Z79899 Other long term (current) drug therapy: Secondary | ICD-10-CM | POA: Diagnosis not present

## 2015-05-31 DIAGNOSIS — N400 Enlarged prostate without lower urinary tract symptoms: Secondary | ICD-10-CM | POA: Diagnosis present

## 2015-05-31 DIAGNOSIS — S2231XA Fracture of one rib, right side, initial encounter for closed fracture: Secondary | ICD-10-CM

## 2015-05-31 LAB — PROTIME-INR
INR: 2.6
Prothrombin Time: 27.5 seconds — ABNORMAL HIGH (ref 11.4–15.0)

## 2015-05-31 LAB — CBC WITH DIFFERENTIAL/PLATELET
Basophils Absolute: 0 10*3/uL (ref 0–0.1)
Basophils Relative: 0 %
Eosinophils Absolute: 0.2 10*3/uL (ref 0–0.7)
Eosinophils Relative: 2 %
HEMATOCRIT: 37.6 % — AB (ref 40.0–52.0)
Hemoglobin: 12.2 g/dL — ABNORMAL LOW (ref 13.0–18.0)
LYMPHS PCT: 12 %
Lymphs Abs: 1.3 10*3/uL (ref 1.0–3.6)
MCH: 31.2 pg (ref 26.0–34.0)
MCHC: 32.4 g/dL (ref 32.0–36.0)
MCV: 96.4 fL (ref 80.0–100.0)
MONO ABS: 0.7 10*3/uL (ref 0.2–1.0)
MONOS PCT: 7 %
NEUTROS ABS: 8.3 10*3/uL — AB (ref 1.4–6.5)
Neutrophils Relative %: 79 %
Platelets: 274 10*3/uL (ref 150–440)
RBC: 3.89 MIL/uL — ABNORMAL LOW (ref 4.40–5.90)
RDW: 13 % (ref 11.5–14.5)
WBC: 10.6 10*3/uL (ref 3.8–10.6)

## 2015-05-31 LAB — BASIC METABOLIC PANEL
Anion gap: 11 (ref 5–15)
BUN: 41 mg/dL — AB (ref 6–20)
CALCIUM: 10.3 mg/dL (ref 8.9–10.3)
CO2: 30 mmol/L (ref 22–32)
CREATININE: 1.41 mg/dL — AB (ref 0.61–1.24)
Chloride: 100 mmol/L — ABNORMAL LOW (ref 101–111)
GFR calc Af Amer: 50 mL/min — ABNORMAL LOW (ref >60)
GFR calc non Af Amer: 44 mL/min — ABNORMAL LOW (ref >60)
GLUCOSE: 112 mg/dL — AB (ref 65–99)
Potassium: 4.5 mmol/L (ref 3.5–5.1)
Sodium: 141 mmol/L (ref 135–145)

## 2015-05-31 LAB — TROPONIN I: TROPONIN I: 0.04 ng/mL — AB (ref <0.031)

## 2015-05-31 LAB — APTT: APTT: 45 s — AB (ref 24–36)

## 2015-05-31 MED ORDER — OXYCODONE HCL 5 MG PO TABS
5.0000 mg | ORAL_TABLET | Freq: Once | ORAL | Status: AC
  Administered 2015-05-31: 5 mg via ORAL

## 2015-05-31 MED ORDER — OXYCODONE-ACETAMINOPHEN 5-325 MG PO TABS: 1.0000 | ORAL_TABLET | Freq: Once | ORAL | Status: DC

## 2015-05-31 MED ORDER — IOHEXOL 300 MG/ML  SOLN
60.0000 mL | Freq: Once | INTRAMUSCULAR | Status: AC | PRN
  Administered 2015-05-31: 60 mL via INTRAVENOUS

## 2015-05-31 MED ORDER — FENTANYL CITRATE (PF) 100 MCG/2ML IJ SOLN
25.0000 ug | Freq: Once | INTRAMUSCULAR | Status: AC
  Administered 2015-05-31: 25 ug via INTRAVENOUS
  Filled 2015-05-31: qty 2

## 2015-05-31 MED ORDER — ACETAMINOPHEN 325 MG PO TABS
650.0000 mg | ORAL_TABLET | Freq: Once | ORAL | Status: AC
  Administered 2015-05-31: 650 mg via ORAL
  Filled 2015-05-31: qty 2

## 2015-05-31 MED ORDER — OXYCODONE HCL 5 MG PO TABS
ORAL_TABLET | ORAL | Status: AC
  Administered 2015-05-31: 5 mg via ORAL
  Filled 2015-05-31: qty 1

## 2015-05-31 NOTE — ED Notes (Signed)
Patient transported to CT 

## 2015-05-31 NOTE — ED Notes (Signed)
Pt in via EMS from [place]; pt reports putting clothes away in closet when his walker started rolling away, "I tried to catch the walker and lost my balance."  Pt reports hitting his head but denies LOC.  Pt w/ complaints of pain to right lower ribcage, skin tear noted to right elbow.  Pt A/Ox4, no immediate distress at this time.

## 2015-05-31 NOTE — H&P (Signed)
[facility] NAME: Daniel Huff    MR#:  [id number]  DATE OF BIRTH:  05-Jun-1926  DATE OF ADMISSION:  05/31/2015  PRIMARY CARE PHYSICIAN: No primary care provider on file.   REQUESTING/REFERRING PHYSICIAN: Edd Fabian, MD  CHIEF COMPLAINT:  Fall  HISTORY OF PRESENT ILLNESS:  Daniel Huff  is a 81 y.o. male who presents with multiple falls recently. Patient came in today after fall at home. He states that he was putting clothes away when he fell backwards, and tripped over his walker. He states that he had a fall about 2 weeks ago, which was also similar, and involved his walker as well. Today he complains of significant right-sided chest pain. Imaging shows some rib fractures. CT also revealed thoracic and lumbar subacute compression fractures. Patient is experiencing very shallow breathing due to his pain, which is leading to some mild hypoxia as well. Hospitals were called for admission for pain control and monitoring. Of note, his CT scan also showed some lung lesions. Specifically he had a left lower lobe spiculated lung lesion which needs follow-up imaging and evaluation.  PAST MEDICAL HISTORY:   Past Medical History  Diagnosis Date  . PVD (peripheral vascular disease) ([place])   . Hypertension   . DJD (degenerative joint disease)   . Ventricular tachycardia (paroxysmal) ([place])   . BPH (benign prostatic hyperplasia)   . Cardiomyopathy ([place])     PAST SURGICAL HISTORY:   Past Surgical History  Procedure Laterality Date  . Pacemaker placement    . Cardiac surgery      SOCIAL HISTORY:   Social History  Substance Use Topics  . Smoking status: Former Smoker    Quit date: 05/06/2012  . Smokeless tobacco: Not on file  . Alcohol Use: Not on file    FAMILY HISTORY:   Family History  Problem Relation Age of Onset  . Heart failure Mother   . Heart failure Father     DRUG ALLERGIES:   Allergies  Allergen Reactions  .  Aspirin Other (See Comments)    Reaction:  Unknown     MEDICATIONS AT HOME:   Prior to Admission medications   Medication Sig Start Date End Date Taking? Authorizing Provider  acetaminophen (TYLENOL) 325 MG tablet Take 975 mg by mouth 3 (three) times daily.    Yes Historical Provider, MD  allopurinol (ZYLOPRIM) 100 MG tablet Take 100 mg by mouth daily.   Yes Historical Provider, MD  Calcium Carbonate-Vitamin D (CALCIUM 600+D) 600-400 MG-UNIT per tablet Take 1 tablet by mouth 2 (two) times daily with a meal.   Yes Historical Provider, MD  finasteride (PROSCAR) 5 MG tablet Take 5 mg by mouth daily.   Yes Historical Provider, MD  guaiFENesin (ROBITUSSIN) 100 MG/5ML liquid Take 100 mg by mouth 2 (two) times daily as needed for cough.    Yes Historical Provider, MD  magnesium oxide (MAG-OX) 400 MG tablet Take 400 mg by mouth daily.   Yes Historical Provider, MD  Melatonin 3 MG TABS Take 3 mg by mouth at bedtime.   Yes Historical Provider, MD  metoprolol succinate (TOPROL-XL) 25 MG 24 hr tablet Take 12.5 mg by mouth daily.   Yes Historical Provider, MD  oxyCODONE (OXY IR/ROXICODONE) 5 MG immediate release tablet Take 2.5 mg by mouth every 4 (four) hours as needed for severe pain.   Yes Historical Provider, MD  senna-docusate (SENOKOT-S) 8.6-50 MG tablet Take 1 tablet by mouth daily.  Yes Historical Provider, MD  simvastatin (ZOCOR) 10 MG tablet Take 5 mg by mouth at bedtime.   Yes Historical Provider, MD  warfarin (COUMADIN) 1 MG tablet Take 1 mg by mouth at bedtime. Pt only takes on Friday.   Yes Historical Provider, MD  warfarin (COUMADIN) 2 MG tablet Take 2 mg by mouth at bedtime. Pt takes on Sunday, Monday, Tuesday, Wednesday, Thursday, and Saturday.   Yes Historical Provider, MD    REVIEW OF SYSTEMS:  Review of Systems  Constitutional: Negative for fever, chills, weight loss and malaise/fatigue.  HENT: Negative for ear pain, hearing loss and tinnitus.   Eyes: Negative for blurred vision,  double vision, pain and redness.  Respiratory: Negative for cough, hemoptysis and shortness of breath.   Cardiovascular: Positive for chest pain (right chest wall). Negative for palpitations, orthopnea and leg swelling.  Gastrointestinal: Negative for nausea, vomiting, abdominal pain, diarrhea and constipation.  Genitourinary: Negative for dysuria, frequency and hematuria.  Musculoskeletal: Positive for back pain and falls. Negative for joint pain and neck pain.  Skin:       No acne, rash, or lesions  Neurological: Negative for dizziness, tremors, focal weakness and weakness.  Endo/Heme/Allergies: Negative for polydipsia. Does not bruise/bleed easily.  Psychiatric/Behavioral: Negative for depression. The patient is not nervous/anxious and does not have insomnia.      VITAL SIGNS:   Filed Vitals:   05/31/15 1830 05/31/15 1900 05/31/15 2000 05/31/15 2030  BP: 151/68 136/67 160/108 129/68  Pulse:  70 111 70  Temp:      TempSrc:      Resp:  25 36 28  Height:      Weight:      SpO2:  96% 78% 90%   Wt Readings from Last 3 Encounters:  05/31/15 63.504 kg (140 lb)  12/26/14 63.504 kg (140 lb)    PHYSICAL EXAMINATION:  Physical Exam  Vitals reviewed. Constitutional: He is oriented to person, place, and time. He appears well-developed and well-nourished. No distress.  HENT:  Head: Normocephalic and atraumatic.  Mouth/Throat: Oropharynx is clear and moist.  Eyes: Conjunctivae and EOM are normal. Pupils are equal, round, and reactive to light. No scleral icterus.  Neck: Normal range of motion. Neck supple. No JVD present. No thyromegaly present.  Cardiovascular: Normal rate, regular rhythm and intact distal pulses.  Exam reveals no gallop and no friction rub.   No murmur heard. Respiratory: Effort normal and breath sounds normal. No respiratory distress. He has no wheezes. He has no rales. He exhibits tenderness (right chest wall).  GI: Soft. Bowel sounds are normal. He exhibits no  distension. There is no tenderness.  Musculoskeletal: Normal range of motion. He exhibits tenderness (Low thoracic and lumbar spinal tenderness). He exhibits no edema.  No arthritis, no gout  Lymphadenopathy:    He has no cervical adenopathy.  Neurological: He is alert and oriented to person, place, and time. No cranial nerve deficit.  No dysarthria, no aphasia  Skin: Skin is warm and dry. No rash noted. No erythema.  Psychiatric: He has a normal mood and affect. His behavior is normal. Judgment and thought content normal.    LABORATORY PANEL:   CBC  Recent Labs Lab 05/31/15 1618  WBC 10.6  HGB 12.2*  HCT 37.6*  PLT 274   ------------------------------------------------------------------------------------------------------------------  Chemistries   Recent Labs Lab 05/31/15 1618  NA 141  K 4.5  CL 100*  CO2 30  GLUCOSE 112*  BUN 41*  CREATININE 1.41*  CALCIUM 10.3   ------------------------------------------------------------------------------------------------------------------  Cardiac Enzymes  Recent Labs Lab 05/31/15 1618  TROPONINI 0.04*   ------------------------------------------------------------------------------------------------------------------  RADIOLOGY:  Dg Chest 2 View  05/31/2015  CLINICAL DATA:  Chest pain following a fall today.  Ex-smoker. EXAM: CHEST  2 VIEW COMPARISON:  12/26/2014. FINDINGS: Stable enlarged cardiac silhouette, post CABG changes and left subclavian pacer and AICD leads. Stable mild linear scarring at the left lung base. Clear right lung. Normal vascularity. Diffuse osteopenia. No fracture pneumothorax seen. IMPRESSION: No acute abnormality. Electronically Signed   By: Claudie Revering M.D.   On: 05/31/2015 16:05   Ct Head Wo Contrast  05/31/2015  CLINICAL DATA:  Pain following fall EXAM: CT HEAD WITHOUT CONTRAST CT CERVICAL SPINE WITHOUT CONTRAST TECHNIQUE: Multidetector CT imaging of the head and cervical spine was performed  following the standard protocol without intravenous contrast. Multiplanar CT image reconstructions of the cervical spine were also generated. COMPARISON:  None. FINDINGS: CT HEAD FINDINGS There is moderate diffuse atrophy. There is no intracranial mass, hemorrhage, extra-axial fluid collection, or midline shift. There is moderate small vessel disease in the centra semiovale bilaterally. There are prior small infarcts in the genu of each right internal capsule. Small infarct on the right also extends into the medial posterior right putamen. There is small vessel disease in the pons bilaterally in the basilar perforator distribution. No acute infarct is evident. The bony calvarium appears intact. The mastoid air cells are clear. No intraorbital lesions are appreciable. There is opacification of multiple ethmoid air cells bilaterally as well as the right maxillary antrum. CT CERVICAL SPINE FINDINGS There is levoscoliosis. No fracture is evident. There is 3 mm of anterolisthesis of C4 on C5. There is 2 mm of retrolisthesis of C5 on C6. There is minimal anterolisthesis of C7 on T1. These areas of spondylolisthesis are felt to be due to underlying spondylosis. The prevertebral soft tissues and predental space regions are normal. Bones are osteoporotic. There is multifocal facet arthropathy. Exit foraminal narrowing is noted at multiple levels, most pronounced at C4-5 on the right and C5-6 bilaterally. There is no frank disc extrusion or high-grade stenosis. There are foci of carotid artery calcification bilaterally. There is also calcification in the visualized aortic arch region. There are several healed posterior right rib fractures which do not appear acute. IMPRESSION: CT head: Atrophy with prior small infarcts and small vessel disease. No intracranial mass, hemorrhage, or extra-axial fluid collection. No acute infarct evident. Multifocal paranasal sinus disease is present. CT cervical spine: No demonstrable fracture.  Scoliosis with several areas of mild spondylolisthesis, felt to be due to underlying spondylosis. There is extensive multifocal osteoarthritic change. Facet hypertrophy overall tends to be more severe on the right than on the left. There are foci of carotid artery calcification bilaterally. Several old right posterior rib fractures which appear healed. Electronically Signed   By: Lowella Grip III M.D.   On: 05/31/2015 16:16   Ct Chest W Contrast  05/31/2015  CLINICAL DATA:  Fall EXAM: CT CHEST WITH CONTRAST TECHNIQUE: Multidetector CT imaging of the chest was performed during intravenous contrast administration. CONTRAST:  21m OMNIPAQUE IOHEXOL 300 MG/ML  SOLN COMPARISON:  None. FINDINGS: Normal thyroid No evidence of mediastinal hemorrhage. No abnormal mediastinal adenopathy. Ascending aortic maximal caliber is 4.1 cm. Scattered atherosclerotic changes are noted within the great vessels. No evidence of aortic dissection or intramural hematoma. No evidence of aortic injury. Left subclavian pacemaker device in place.  Status post CABG. No pneumothorax or pleural effusion. Hyperaeration. Scattered linear atelectasis. 1.1 x  2.3 cm spiculated superior segment left lower lobe lung masses worrisome for malignancy. No pneumothorax or pleural effusion. Subacute right rib fractures on the right are present on images 19, 26, and 32. Acute right rib fractures laterally on image 33, 40, and 43. Chronic additional right rib deformities. Subacute posterior right rib fracture on image number 40. Acute right rib fracture on image 47 posteriorly. There are compression fractures at T12, T5, T3, and T4. There is 50% loss of height at T12. Mild loss of height occurs at T4 and T5. 30% loss of height anteriorly at T3. There is no obvious retropulsion at these fractures. Benign appearing cyst in the liver. Calcified granulomata in the liver. Renal cysts. IMPRESSION: There are acute and subacute right-sided rib fractures Multiple  thoracic compression deformities of indeterminate age. MRI can be performed to further delineate. 2.3 cm mass in the superior segment of the left lower lobe is worrisome for malignancy. PET-CT is recommended to further characterize. Ascending aorta is 4.1 cm in caliber. Recommend annual imaging followup by CTA or MRA. This recommendation follows 2010 ACCF/AHA/AATS/ACR/ASA/SCA/SCAI/SIR/STS/SVM Guidelines for the Diagnosis and Management of Patients with Thoracic Aortic Disease. Circulation. 2010; 121: R154-M086 Electronically Signed   By: Marybelle Killings M.D.   On: 05/31/2015 18:43   Ct Cervical Spine Wo Contrast  05/31/2015  : CT head and CT cervical spine reports are combined into a single dictation. Electronically Signed   By: Lowella Grip III M.D.   On: 05/31/2015 16:16   Ct Lumbar Spine Wo Contrast  05/31/2015  CLINICAL DATA:  Pain status post fall. EXAM: CT LUMBAR SPINE WITHOUT CONTRAST TECHNIQUE: Multidetector CT imaging of the lumbar spine was performed without intravenous contrast administration. Multiplanar CT image reconstructions were also generated. COMPARISON:  None. FINDINGS: There is a 12 mm right lower lobe subpleural mass versus area of round atelectasis. There is fusiform dilation of the abdominal aorta with maximum transverse diameter of 3.2 cm, on the background of advanced calcified atherosclerotic disease. There is residual contrast opacification of bilateral renal collecting systems. 3 mm vascular calcification versus nonobstructing right renal calculus is seen. There is a 1.6 cm left renal cyst. There is diffuse osteopenia. There is superior endplate compression deformity of T12 vertebral body with approximately 50% height loss, without evidence of acute fracture line. There is milder compression deformity of L4 vertebral body with approximately 30% height loss. There is are moderate osteoarthritic changes of the lumbosacral spine, with posterior facet arthropathy, more significant in  the lower lumbosacral spine, at L4-L5 and L5-S1. There is curvilinear sclerotic line through the right sacrum, which may represent a subacute fracture. Posterior right T11 and T12 probably subacute fractures are seen. IMPRESSION: Diffuse osteopenia. Superior endplate compression deformity of T12 vertebral body with approximately 50% height loss, likely subacute. Superior endplate compression deformity of L4 vertebral body with approximate 30% height loss, likely subacute. Curvilinear sclerotic line through the right sacrum may represent an old sacral fracture. Posterior right T11 and T12 probably subacute rib fractures. Moderate osteoarthritic changes of the lumbosacral spine, with posterior facet arthropathy, most significant in the lower lumbosacral spine. Fusiform dilation of the proximal abdominal aorta with maximum diameter of 3.2 cm. Recommend followup by ultrasound in 3 years. This recommendation follows ACR consensus guidelines: White Paper of the ACR Incidental Findings Committee II on Vascular Findings. J Am Coll Radiol 2013; 76:195-093 12 mm right lower lobe subpleural soft tissue thickening which may represent an area of round atelectasis or true pulmonary mass. Attention  on future follow-up is recommended. Electronically Signed   By: Fidela Salisbury M.D.   On: 05/31/2015 20:25    EKG:   Orders placed or performed during the hospital encounter of 05/31/15  . ED EKG  . ED EKG  . EKG 12-Lead  . EKG 12-Lead    IMPRESSION AND PLAN:  Principal Problem:   Multiple falls - these falls tend to be mechanical in nature, no loss of consciousness. See below for treatment of complications coming from sequela of his falls. Active Problems:   Multiple rib fractures - incentive spirometry to ensure deep breathing for this patient. Pain control.   Compression fracture of T12 and L4 vertebrae ([facility]) - pain control, PT and OT evaluations.   Lung mass - patient will need follow-up for this lesion. Left  lower lobe lesion is spiculated, which is concerning for possible malignancy.   HTN (hypertension) - stable, continue home meds   Aortic valve replaced - stable, continue anticoagulation with warfarin   CAD (coronary artery disease) - stable, continue home   BPH (benign prostatic hyperplasia) - continue home meds  All the records are reviewed and case discussed with ED provider. Management plans discussed with the patient and/or family.  DVT PROPHYLAXIS: Systemic anticoagulation  GI PROPHYLAXIS: None  ADMISSION STATUS: Inpatient  CODE STATUS: Full Code Status History    This patient does not have a recorded code status. Please follow your organizational policy for patients in this situation.      TOTAL TIME TAKING CARE OF THIS PATIENT: 45 minutes.    Jaylei Fuerte FIELDING 05/31/2015, 10:05 PM  [facility]  Office  [phone]  CC: Primary care physician; No primary care provider on file.

## 2015-05-31 NOTE — ED Notes (Signed)
Pt 87% on room air; pt placed on 2L nasal cannula to maintain sat >90%

## 2015-05-31 NOTE — ED Provider Notes (Signed)
[facility] Emergency Department Provider Note  ____________________________________________  Time seen: Approximately 3:24 PM  I have reviewed the triage vital signs and the nursing notes.   HISTORY  Chief Complaint No chief complaint on file.    HPI Corazon is a 82 y.o. male with history of peripheral vascular disease, ischemic cardiomyopathy status post pacemaker, aortic valve replacement on Coumadin presents for evaluation of mechanical fall with head injury which occurred suddenly just prior to arrival. He reports minimal pain in his head, gradual onset, constant since onset, no modifying factors. The patient reports that he was going away his laundry just before he fell. He was trying to put some socks away, turned towards his walker and his walker had rolled away from him. He reached out to try and retrieve the walker and fell, hitting his head. No Loss of consciousness. He is complaining of mild neck pain. He is complaining of pain in the right chest which began last week after he had a fall. He has no other pain complaints. He reports he has otherwise been in his usual state of health without illness. No vomiting, diarrhea, fevers, chills, orders of breath.   Past Medical History  Diagnosis Date  . PVD (peripheral vascular disease) ([place])   . Hypertension   . DJD (degenerative joint disease)   . Ventricular tachycardia (paroxysmal) ([place])   . BPH (benign prostatic hyperplasia)   . Cardiomyopathy ([place])     There are no active problems to display for this patient.   Past Surgical History  Procedure Laterality Date  . Pacemaker placement    . Cardiac surgery      Current Outpatient Rx  Name  Route  Sig  Dispense  Refill  . acetaminophen (TYLENOL) 325 MG tablet   Oral   Take 975 mg by mouth 3 (three) times daily.          Marland Kitchen allopurinol (ZYLOPRIM) 100 MG tablet   Oral   Take 100 mg by mouth daily.         . Calcium  Carbonate-Vitamin D (CALCIUM 600+D) 600-400 MG-UNIT per tablet   Oral   Take 1 tablet by mouth 2 (two) times daily with a meal.         . finasteride (PROSCAR) 5 MG tablet   Oral   Take 5 mg by mouth daily.         Marland Kitchen guaiFENesin (ROBITUSSIN) 100 MG/5ML liquid   Oral   Take 100 mg by mouth 2 (two) times daily as needed for cough.          . magnesium oxide (MAG-OX) 400 MG tablet   Oral   Take 400 mg by mouth daily.         . Melatonin 3 MG TABS   Oral   Take 3 mg by mouth at bedtime.         . metoprolol succinate (TOPROL-XL) 25 MG 24 hr tablet   Oral   Take 12.5 mg by mouth daily.         Marland Kitchen oxyCODONE (OXY IR/ROXICODONE) 5 MG immediate release tablet   Oral   Take 2.5 mg by mouth every 4 (four) hours as needed for severe pain.         Marland Kitchen senna-docusate (SENOKOT-S) 8.6-50 MG tablet   Oral   Take 1 tablet by mouth daily.         . simvastatin (ZOCOR) 10 MG tablet   Oral   Take  5 mg by mouth at bedtime.         Marland Kitchen warfarin (COUMADIN) 1 MG tablet   Oral   Take 1 mg by mouth at bedtime. Pt only takes on Friday.         . warfarin (COUMADIN) 2 MG tablet   Oral   Take 2 mg by mouth at bedtime. Pt takes on Sunday, Monday, Tuesday, Wednesday, Thursday, and Saturday.           Allergies Aspirin  No family history on file.  Social History Social History  Substance Use Topics  . Smoking status: Former Smoker    Quit date: 05/06/2012  . Smokeless tobacco: None  . Alcohol Use: None    Review of Systems Constitutional: No fever/chills Eyes: No visual changes. ENT: No sore throat. Cardiovascular: +right chest pain. Respiratory: Denies shortness of breath. Gastrointestinal: No abdominal pain.  No nausea, no vomiting.  No diarrhea.  No constipation. Genitourinary: Negative for dysuria. Musculoskeletal: Negative for back pain. Skin: Negative for rash. Neurological: Positive for mild headache, no focal weakness or numbness.  10-point ROS  otherwise negative.  ____________________________________________   PHYSICAL EXAM:  Filed Vitals:   05/31/15 1730 05/31/15 1800 05/31/15 1830 05/31/15 1900  BP: 160/79 157/68 151/68 136/67  Pulse: 90 92  70  Temp:      TempSrc:      Resp: 28 33  25  Height:      Weight:      SpO2: 91% 91%  96%     Constitutional: Alert and oriented x 4. Well appearing and in no acute distress. Eyes: Conjunctivae are normal. PERRL. EOMI. Head: Small hematoma to the right parietal scalp. Nose: No congestion/rhinnorhea. Mouth/Throat: Mucous membranes are moist.  Oropharynx non-erythematous. Neck: No stridor.  No cervical spine tenderness to palpation. Cardiovascular: Normal rate, regular rhythm. Grossly normal heart sounds.  Good peripheral circulation. Respiratory: Normal respiratory effort.  No retractions. Lungs CTAB. Gastrointestinal: Soft and nontender. No distention.  No CVA tenderness. Genitourinary: deferred Musculoskeletal: No lower extremity tenderness nor edema.  No joint effusions.Tenderness throughout the right lateral chest wall without flail chest or deformity. Mild  midline T  tenderness to palpation. Pelvis is stable to rock and compression. Full range of motion at bilateral hip joints.  Neurologic:  Normal speech and language. No gross focal neurologic deficits are appreciated. 5 out of 5 strength bilateral upper and lower extremities, sensation intact to light touch throughout.  Skin:  Skin is warm, dry and intact. No rash noted. Psychiatric: Mood and affect are normal. Speech and behavior are normal.  ____________________________________________   LABS (all labs ordered are listed, but only abnormal results are displayed)  Labs Reviewed  CBC WITH DIFFERENTIAL/PLATELET - Abnormal; Notable for the following:    RBC 3.89 (*)    Hemoglobin 12.2 (*)    HCT 37.6 (*)    Neutro Abs 8.3 (*)    All other components within normal limits  BASIC METABOLIC PANEL - Abnormal; Notable  for the following:    Chloride 100 (*)    Glucose, Bld 112 (*)    BUN 41 (*)    Creatinine, Ser 1.41 (*)    GFR calc non Af Amer 44 (*)    GFR calc Af Amer 50 (*)    All other components within normal limits  PROTIME-INR - Abnormal; Notable for the following:    Prothrombin Time 27.5 (*)    All other components within normal limits  APTT - Abnormal; Notable  for the following:    aPTT 45 (*)    All other components within normal limits  TROPONIN I - Abnormal; Notable for the following:    Troponin I 0.04 (*)    All other components within normal limits   ____________________________________________  EKG  ED ECG REPORT I, Joanne Gavel, the attending physician, personally viewed and interpreted this ECG.   Date: 05/31/2015  EKG Time: 15:28  Rate: 75  Rhythm:  A-V paced rhythm  ____________________________________________  RADIOLOGY  CT head and c-spine IMPRESSION: CT head: Atrophy with prior small infarcts and small vessel disease. No intracranial mass, hemorrhage, or extra-axial fluid collection. No acute infarct evident. Multifocal paranasal sinus disease is present.  CT cervical spine: No demonstrable fracture. Scoliosis with several areas of mild spondylolisthesis, felt to be due to underlying spondylosis. There is extensive multifocal osteoarthritic change. Facet hypertrophy overall tends to be more severe on the right than on the left.  There are foci of carotid artery calcification bilaterally. Several old right posterior rib fractures which appear healed.  CXR IMPRESSION: No acute abnormality.  CT lumbar IMPRESSION: Diffuse osteopenia.  Superior endplate compression deformity of T12 vertebral body with approximately 50% height loss, likely subacute.  Superior endplate compression deformity of L4 vertebral body with approximate 30% height loss, likely subacute.  Curvilinear sclerotic line through the right sacrum may represent an old sacral  fracture.  Posterior right T11 and T12 probably subacute rib fractures.  Moderate osteoarthritic changes of the lumbosacral spine, with posterior facet arthropathy, most significant in the lower lumbosacral spine.  Fusiform dilation of the proximal abdominal aorta with maximum diameter of 3.2 cm. Recommend followup by ultrasound in 3 years. This recommendation follows ACR consensus guidelines: White Paper of the ACR Incidental Findings Committee II on Vascular Findings. J Am Coll Radiol 2013; 73:220-254  12 mm right lower lobe subpleural soft tissue thickening which may represent an area of round atelectasis or true pulmonary mass. Attention on future follow-up is recommended.  CT chest IMPRESSION: There are acute and subacute right-sided rib fractures  Multiple thoracic compression deformities of indeterminate age. MRI can be performed to further delineate.  2.3 cm mass in the superior segment of the left lower lobe is worrisome for malignancy. PET-CT is recommended to further characterize.  Ascending aorta is 4.1 cm in caliber. Recommend annual imaging followup by CTA or MRA. This recommendation follows 2010 ACCF/AHA/AATS/ACR/ASA/SCA/SCAI/SIR/STS/SVM Guidelines for the Diagnosis and Management of Patients with Thoracic Aortic Disease. Circulation. 2010; 121: Y706-C376 ____________________________________________   PROCEDURES  Procedure(s) performed: None  Critical Care performed: None  ____________________________________________   INITIAL IMPRESSION / ASSESSMENT AND PLAN / ED COURSE  Pertinent labs & imaging results that were available during my care of the patient were reviewed by me and considered in my medical decision making (see chart for details).  Ildefonso Kavi Almquist is a 82 y.o. male with history of peripheral vascular disease, ischemic cardiomyopathy status post pacemaker, aortic valve replacement on Coumadin presents for evaluation of mechanical fall  with head injury which occurred suddenly just prior to arrival. On exam, he is very well-appearing and in no acute distress. I'll signs unremarkable with the exception of mild tachypnea which may be pain related. he is afebrile. His exam is notable for a right parietal scalp hematoma as well as tenderness throughout the right lateral chest wall but otherwise his exam is atraumatic. Plan for screening labs, CT head and C-spine as well as chest x-ray. Reassess for disposition.  ----------------------------------------- 5:42  PM on 05/31/2015 ----------------------------------------- Imaging negative for any acute pathology. Labs reviewed. CBC notable for mild anemia which appears chronic. Creatinine elevated at 1.41, pursue be baseline. INR 2.6. Patient is now complaining of having pain in the right chest, where there is tenderness. C-spine CT scan shows healed rib fractures. We will treat his pain and reassess.  ----------------------------------------- 7:13 PM on 05/31/2015 ----------------------------------------- Patient with continued complaint of chest pain. CT scan obtained which shows at minimum 4 acute rib fractures. O2 sat 87% on room air when he sleeps. CT scan also notable for multiple compression deformities of the T-spine, unsure whether acute, no significant retropulsion. We'll also obtain CT lumbar spine given his complaint of back pain at this time and anticipate admission.  ----------------------------------------- 8:48 PM on 05/31/2015 ----------------------------------------- CT of the lumbar spine shows likely subacute fractures of the L-spine and the sacrum, no acute fractures. Question of possible pulmonary mass noted on CT as well. Case discussed with the hospitalist, Dr. Jannifer Franklin, at this time for admission given significant pain.     ____________________________________________   FINAL CLINICAL IMPRESSION(S) / ED DIAGNOSES  Final diagnoses:  Fall, initial encounter   Compression fracture of thoracic vertebra, closed, initial encounter [facility])  Rib fractures, right, closed, initial encounter      Joanne Gavel, MD 05/31/15 2358

## 2015-05-31 NOTE — ED Notes (Signed)
Primary nurse Caryl Pina, RN) out of room to advise that patient dropped his SPO2 to 87% on RA. Patient placed on 2L/[place] supplemental oxygen by Caryl Pina, RN; sats improved to 99%. MD made aware.

## 2015-05-31 NOTE — ED Notes (Signed)
Patient with observed run of VT on the monitor. RN to bedside - patient is asymptomatic and has not demonstrated any changes in LOC or VS as compared to previous assessments per primary RN. Evidence of observed 14 beat run of VT printed and reviewed attending EDP Edd Fabian, MD) and admitting hospitalist Jannifer Franklin, MD); copy of rhythm strip placed on the patient's chart. No new orders received at this time. Will continue to monitor.

## 2015-06-01 LAB — CBC
HCT: 33.2 % — ABNORMAL LOW (ref 40.0–52.0)
HEMOGLOBIN: 10.9 g/dL — AB (ref 13.0–18.0)
MCH: 32.3 pg (ref 26.0–34.0)
MCHC: 33 g/dL (ref 32.0–36.0)
MCV: 97.9 fL (ref 80.0–100.0)
Platelets: 232 10*3/uL (ref 150–440)
RBC: 3.39 MIL/uL — AB (ref 4.40–5.90)
RDW: 12.7 % (ref 11.5–14.5)
WBC: 7.5 10*3/uL (ref 3.8–10.6)

## 2015-06-01 LAB — BASIC METABOLIC PANEL
ANION GAP: 2 — AB (ref 5–15)
BUN: 32 mg/dL — ABNORMAL HIGH (ref 6–20)
CALCIUM: 9.1 mg/dL (ref 8.9–10.3)
CHLORIDE: 107 mmol/L (ref 101–111)
CO2: 31 mmol/L (ref 22–32)
CREATININE: 1.35 mg/dL — AB (ref 0.61–1.24)
GFR calc non Af Amer: 46 mL/min — ABNORMAL LOW (ref >60)
GFR, EST AFRICAN AMERICAN: 53 mL/min — AB (ref >60)
GLUCOSE: 104 mg/dL — AB (ref 65–99)
Potassium: 4.3 mmol/L (ref 3.5–5.1)
Sodium: 140 mmol/L (ref 135–145)

## 2015-06-01 LAB — TROPONIN I
TROPONIN I: 0.04 ng/mL — AB (ref <0.031)
TROPONIN I: 0.05 ng/mL — AB (ref <0.031)
Troponin I: 0.05 ng/mL — ABNORMAL HIGH (ref <0.031)

## 2015-06-01 MED ORDER — METOPROLOL SUCCINATE ER 25 MG PO TB24
12.5000 mg | ORAL_TABLET | Freq: Every day | ORAL | Status: DC
  Administered 2015-06-01 – 2015-06-06 (×6): 12.5 mg via ORAL
  Filled 2015-06-01 (×7): qty 1

## 2015-06-01 MED ORDER — ACETAMINOPHEN 650 MG RE SUPP: 650.0000 mg | Freq: Four times a day (QID) | RECTAL | Status: DC | PRN

## 2015-06-01 MED ORDER — LIDOCAINE 5 % EX PTCH
1.0000 | MEDICATED_PATCH | CUTANEOUS | Status: DC
  Administered 2015-06-01 – 2015-06-06 (×6): 1 via TRANSDERMAL
  Filled 2015-06-01 (×9): qty 1

## 2015-06-01 MED ORDER — WARFARIN - PHARMACIST DOSING INPATIENT
Freq: Every day | Status: DC
  Administered 2015-06-01 – 2015-06-05 (×5)

## 2015-06-01 MED ORDER — ACETAMINOPHEN 325 MG PO TABS: 650.0000 mg | ORAL_TABLET | Freq: Four times a day (QID) | ORAL | Status: DC | PRN

## 2015-06-01 MED ORDER — ONDANSETRON HCL 4 MG PO TABS: 4.0000 mg | ORAL_TABLET | Freq: Four times a day (QID) | ORAL | Status: DC | PRN

## 2015-06-01 MED ORDER — OXYCODONE HCL 5 MG PO TABS
5.0000 mg | ORAL_TABLET | ORAL | Status: DC | PRN
  Administered 2015-06-01 – 2015-06-06 (×9): 5 mg via ORAL
  Filled 2015-06-01 (×9): qty 1

## 2015-06-01 MED ORDER — ONDANSETRON HCL 4 MG/2ML IJ SOLN: 4.0000 mg | Freq: Four times a day (QID) | INTRAMUSCULAR | Status: DC | PRN

## 2015-06-01 MED ORDER — SODIUM CHLORIDE 0.9 % IV SOLN
INTRAVENOUS | Status: AC
  Administered 2015-06-01: 03:00:00 via INTRAVENOUS

## 2015-06-01 MED ORDER — FINASTERIDE 5 MG PO TABS
5.0000 mg | ORAL_TABLET | Freq: Every day | ORAL | Status: DC
  Administered 2015-06-01 – 2015-06-06 (×6): 5 mg via ORAL
  Filled 2015-06-01 (×6): qty 1

## 2015-06-01 MED ORDER — HYDROMORPHONE HCL 1 MG/ML IJ SOLN
0.5000 mg | INTRAMUSCULAR | Status: DC | PRN
  Administered 2015-06-01: 0.5 mg via INTRAVENOUS
  Filled 2015-06-01: qty 1

## 2015-06-01 MED ORDER — WARFARIN SODIUM 1 MG PO TABS
2.0000 mg | ORAL_TABLET | ORAL | Status: DC
  Administered 2015-06-01: 2 mg via ORAL
  Filled 2015-06-01: qty 2

## 2015-06-01 MED ORDER — SODIUM CHLORIDE 0.9% FLUSH
3.0000 mL | Freq: Two times a day (BID) | INTRAVENOUS | Status: DC
  Administered 2015-06-01 – 2015-06-06 (×11): 3 mL via INTRAVENOUS

## 2015-06-01 MED ORDER — WARFARIN SODIUM 1 MG PO TABS
1.0000 mg | ORAL_TABLET | ORAL | Status: DC
Start: 2015-06-02 — End: 2015-06-02

## 2015-06-01 NOTE — Progress Notes (Signed)
[facility] NAME: Daniel Huff    MR#:  [id number]  DATE OF BIRTH:  Feb 23, 1927  SUBJECTIVE: Patient is admitted because of multiple falls, to have rib fractures in thoracic and lumbar vertebra. Also found to have a lung mass. Patient says that her pain is better today. She says she knows about the lung mass and getting treatment for that.  But I don't see anything in Epic.   CHIEF COMPLAINT:  No chief complaint on file.   REVIEW OF SYSTEMS:   ROS CONSTITUTIONAL: No fever, fatigue or weakness.  EYES: No blurred or double vision.  EARS, NOSE, AND THROAT: No tinnitus or ear pain.  RESPIRATORY:  cough, shortness of breath, wheezing or hemoptysis.  CARDIOVASCULAR: No chest pain, orthopnea, edema.  GASTROINTESTINAL: No nausea, vomiting, diarrhea or abdominal pain.  GENITOURINARY: No dysuria, hematuria.  ENDOCRINE: No polyuria, nocturia,  HEMATOLOGY: No anemia, easy bruising or bleeding SKIN: No rash or lesion. MUSCULOSKELETAL: Lot of back pain.  NEUROLOGIC: No tingling, numbness, weakness.  PSYCHIATRY: No anxiety or depression.   DRUG ALLERGIES:   Allergies  Allergen Reactions  . Aspirin Other (See Comments)    Reaction:  Unknown     VITALS:  Blood pressure 123/61, pulse 70, temperature 98.1 F (36.7 C), temperature source Oral, resp. rate 18, height '5\' 7"'$  (1.702 m), weight 55.067 kg (121 lb 6.4 oz), SpO2 100 %.  PHYSICAL EXAMINATION:  GENERAL:  82 y.o.-year-old patient lying in the bed with no acute distress.  EYES: Pupils equal, round, reactive to light and accommodation. No scleral icterus. Extraocular muscles intact.  HEENT: Head atraumatic, normocephalic. Oropharynx and nasopharynx clear.  NECK:  Supple, no jugular venous distention. No thyroid enlargement, no tenderness.  LUNGS: Normal breath sounds bilaterally, no wheezing, rales,rhonchi or crepitation. No use of accessory muscles of respiration.   CARDIOVASCULAR: S1, S2 normal. No murmurs, rubs, or gallops.  ABDOMEN: Soft, nontender, nondistended. Bowel sounds present. No organomegaly or mass.  EXTREMITIES: No pedal edema, cyanosis, or clubbing.  NEUROLOGIC: Cranial nerves II through XII are intact. Muscle strength 5/5 in all extremities. Sensation intact. Gait not checked.  PSYCHIATRIC: The patient is alert and oriented x 3.  SKIN: No obvious rash, lesion, or ulcer.    LABORATORY PANEL:   CBC  Recent Labs Lab 06/01/15 0717  WBC 7.5  HGB 10.9*  HCT 33.2*  PLT 232   ------------------------------------------------------------------------------------------------------------------  Chemistries   Recent Labs Lab 06/01/15 0717  NA 140  K 4.3  CL 107  CO2 31  GLUCOSE 104*  BUN 32*  CREATININE 1.35*  CALCIUM 9.1   ------------------------------------------------------------------------------------------------------------------  Cardiac Enzymes  Recent Labs Lab 06/01/15 0717  TROPONINI 0.04*   ------------------------------------------------------------------------------------------------------------------  RADIOLOGY:  Dg Chest 2 View  05/31/2015  CLINICAL DATA:  Chest pain following a fall today.  Ex-smoker. EXAM: CHEST  2 VIEW COMPARISON:  12/26/2014. FINDINGS: Stable enlarged cardiac silhouette, post CABG changes and left subclavian pacer and AICD leads. Stable mild linear scarring at the left lung base. Clear right lung. Normal vascularity. Diffuse osteopenia. No fracture pneumothorax seen. IMPRESSION: No acute abnormality. Electronically Signed   By: Claudie Revering M.D.   On: 05/31/2015 16:05   Ct Head Wo Contrast  05/31/2015  CLINICAL DATA:  Pain following fall EXAM: CT HEAD WITHOUT CONTRAST CT CERVICAL SPINE WITHOUT CONTRAST TECHNIQUE: Multidetector CT imaging of the head and cervical spine was performed following the standard protocol without intravenous contrast. Multiplanar CT image reconstructions of  the  cervical spine were also generated. COMPARISON:  None. FINDINGS: CT HEAD FINDINGS There is moderate diffuse atrophy. There is no intracranial mass, hemorrhage, extra-axial fluid collection, or midline shift. There is moderate small vessel disease in the centra semiovale bilaterally. There are prior small infarcts in the genu of each right internal capsule. Small infarct on the right also extends into the medial posterior right putamen. There is small vessel disease in the pons bilaterally in the basilar perforator distribution. No acute infarct is evident. The bony calvarium appears intact. The mastoid air cells are clear. No intraorbital lesions are appreciable. There is opacification of multiple ethmoid air cells bilaterally as well as the right maxillary antrum. CT CERVICAL SPINE FINDINGS There is levoscoliosis. No fracture is evident. There is 3 mm of anterolisthesis of C4 on C5. There is 2 mm of retrolisthesis of C5 on C6. There is minimal anterolisthesis of C7 on T1. These areas of spondylolisthesis are felt to be due to underlying spondylosis. The prevertebral soft tissues and predental space regions are normal. Bones are osteoporotic. There is multifocal facet arthropathy. Exit foraminal narrowing is noted at multiple levels, most pronounced at C4-5 on the right and C5-6 bilaterally. There is no frank disc extrusion or high-grade stenosis. There are foci of carotid artery calcification bilaterally. There is also calcification in the visualized aortic arch region. There are several healed posterior right rib fractures which do not appear acute. IMPRESSION: CT head: Atrophy with prior small infarcts and small vessel disease. No intracranial mass, hemorrhage, or extra-axial fluid collection. No acute infarct evident. Multifocal paranasal sinus disease is present. CT cervical spine: No demonstrable fracture. Scoliosis with several areas of mild spondylolisthesis, felt to be due to underlying spondylosis. There  is extensive multifocal osteoarthritic change. Facet hypertrophy overall tends to be more severe on the right than on the left. There are foci of carotid artery calcification bilaterally. Several old right posterior rib fractures which appear healed. Electronically Signed   By: Lowella Grip III M.D.   On: 05/31/2015 16:16   Ct Chest W Contrast  05/31/2015  CLINICAL DATA:  Fall EXAM: CT CHEST WITH CONTRAST TECHNIQUE: Multidetector CT imaging of the chest was performed during intravenous contrast administration. CONTRAST:  62m OMNIPAQUE IOHEXOL 300 MG/ML  SOLN COMPARISON:  None. FINDINGS: Normal thyroid No evidence of mediastinal hemorrhage. No abnormal mediastinal adenopathy. Ascending aortic maximal caliber is 4.1 cm. Scattered atherosclerotic changes are noted within the great vessels. No evidence of aortic dissection or intramural hematoma. No evidence of aortic injury. Left subclavian pacemaker device in place.  Status post CABG. No pneumothorax or pleural effusion. Hyperaeration. Scattered linear atelectasis. 1.1 x 2.3 cm spiculated superior segment left lower lobe lung masses worrisome for malignancy. No pneumothorax or pleural effusion. Subacute right rib fractures on the right are present on images 19, 26, and 32. Acute right rib fractures laterally on image 33, 40, and 43. Chronic additional right rib deformities. Subacute posterior right rib fracture on image number 40. Acute right rib fracture on image 47 posteriorly. There are compression fractures at T12, T5, T3, and T4. There is 50% loss of height at T12. Mild loss of height occurs at T4 and T5. 30% loss of height anteriorly at T3. There is no obvious retropulsion at these fractures. Benign appearing cyst in the liver. Calcified granulomata in the liver. Renal cysts. IMPRESSION: There are acute and subacute right-sided rib fractures Multiple thoracic compression deformities of indeterminate age. MRI can be performed to further delineate. 2.3  cm  mass in the superior segment of the left lower lobe is worrisome for malignancy. PET-CT is recommended to further characterize. Ascending aorta is 4.1 cm in caliber. Recommend annual imaging followup by CTA or MRA. This recommendation follows 2010 ACCF/AHA/AATS/ACR/ASA/SCA/SCAI/SIR/STS/SVM Guidelines for the Diagnosis and Management of Patients with Thoracic Aortic Disease. Circulation. 2010; 121: K932-I712 Electronically Signed   By: Marybelle Killings M.D.   On: 05/31/2015 18:43   Ct Cervical Spine Wo Contrast  05/31/2015  : CT head and CT cervical spine reports are combined into a single dictation. Electronically Signed   By: Lowella Grip III M.D.   On: 05/31/2015 16:16   Ct Lumbar Spine Wo Contrast  05/31/2015  CLINICAL DATA:  Pain status post fall. EXAM: CT LUMBAR SPINE WITHOUT CONTRAST TECHNIQUE: Multidetector CT imaging of the lumbar spine was performed without intravenous contrast administration. Multiplanar CT image reconstructions were also generated. COMPARISON:  None. FINDINGS: There is a 12 mm right lower lobe subpleural mass versus area of round atelectasis. There is fusiform dilation of the abdominal aorta with maximum transverse diameter of 3.2 cm, on the background of advanced calcified atherosclerotic disease. There is residual contrast opacification of bilateral renal collecting systems. 3 mm vascular calcification versus nonobstructing right renal calculus is seen. There is a 1.6 cm left renal cyst. There is diffuse osteopenia. There is superior endplate compression deformity of T12 vertebral body with approximately 50% height loss, without evidence of acute fracture line. There is milder compression deformity of L4 vertebral body with approximately 30% height loss. There is are moderate osteoarthritic changes of the lumbosacral spine, with posterior facet arthropathy, more significant in the lower lumbosacral spine, at L4-L5 and L5-S1. There is curvilinear sclerotic line through the right  sacrum, which may represent a subacute fracture. Posterior right T11 and T12 probably subacute fractures are seen. IMPRESSION: Diffuse osteopenia. Superior endplate compression deformity of T12 vertebral body with approximately 50% height loss, likely subacute. Superior endplate compression deformity of L4 vertebral body with approximate 30% height loss, likely subacute. Curvilinear sclerotic line through the right sacrum may represent an old sacral fracture. Posterior right T11 and T12 probably subacute rib fractures. Moderate osteoarthritic changes of the lumbosacral spine, with posterior facet arthropathy, most significant in the lower lumbosacral spine. Fusiform dilation of the proximal abdominal aorta with maximum diameter of 3.2 cm. Recommend followup by ultrasound in 3 years. This recommendation follows ACR consensus guidelines: White Paper of the ACR Incidental Findings Committee II on Vascular Findings. J Am Coll Radiol 2013; 45:809-983 12 mm right lower lobe subpleural soft tissue thickening which may represent an area of round atelectasis or true pulmonary mass. Attention on future follow-up is recommended. Electronically Signed   By: Fidela Salisbury M.D.   On: 05/31/2015 20:25    EKG:   Orders placed or performed during the hospital encounter of 05/31/15  . ED EKG  . ED EKG  . EKG 12-Lead  . EKG 12-Lead    ASSESSMENT AND PLAN:   #1 multiple falls secondary to osteopenia, compression fractures T11-T12, L4. Continue pain medications, obtain orthopedic consult. #2 left  lung mass: Oncology consult,   #3 hypernatremia: Continue IV hydration. #4 mildly elevated troponins likely secondary to pleuritic chest pain due to rib fractures. #5 history of PVD H/o Vtach:NSVT; *BPH: Continue IV fluids, IV pain medication. Consult oncology regarding lung mass. Consult orthopedic secondary to compression fractures. Can be moved out of tele  All the records are reviewed and case discussed with  Care  Management/Social Workerr. Management plans discussed with the patient, family and they are in agreement.  CODE STATUS: full TOTAL TIME TAKING CARE OF THIS PATIENT: 16mnutes.   POSSIBLE D/C IN 1-2 DAYS, DEPENDING ON CLINICAL CONDITION.   KEpifanio LeschesM.D on 06/01/2015 at 11:46 AM  Between 7am to 6pm - Pager - [phone]  After 6pm go to [email or web address] - password [email or web address]  Office  [phone] CC: Primary care physician; No primary care provider on file.   Note: This dictation was prepared with Dragon dictation along with smaller phrase technology. Any transcriptional errors that result from this process are unintentional.

## 2015-06-01 NOTE — Progress Notes (Signed)
[facility]  Date of admission:  05/31/2015  Inpatient day:  06/01/2015  Consulting physician:  Dr. Epifanio Lesches  Reason for Consultation:  Left lung mass with vertebral compression fractures  Chief Complaint: Daniel Huff is a 82 y.o. male who was admitted through the emergency room with compression fractures s/p fall  HPI:  The patient lives in an assisted living facility.  He describes a 25 pack year smoking history.  He stopped smoking in 2014.  He comments that an unspecified period of time, he has lost 10 pounds. His appetite is good.  Meals are prepared for him.  He describes 2 recent falls.  He presented with right sided chest pain after the sceond fall.  He also notes back pain.   CXR  revealed diffuse osteopenia.  Head CT revealed atrophy with prior small infarcts.  Cervical spine CT revealed extensive multifocal osteoarthritic changes.  Chest CT revealed acute and subacute rib fractures, multiple thoracic compression deformities, and a 2.3 cm spiculated mass in the superior segment of the left lower lobe.  Lumbar spine CT revealed a 1.2 cm right lower lobe subpleural mass versus round atelectasis.  Symptomatically, he notes right rib and back pain.  He denies any respiratory symptoms. He denies any abdominal symptoms.  He states that he gets all of his care at the [facility] by Dr. Rogers Blocker.  He states that the ambulance brought him to [facility] as there were no beds at the [facility].  Past Medical History  Diagnosis Date  . PVD (peripheral vascular disease) ([place])   . Hypertension   . DJD (degenerative joint disease)   . Ventricular tachycardia (paroxysmal) ([place])   . BPH (benign prostatic hyperplasia)   . Cardiomyopathy [facility])     Past Surgical History  Procedure Laterality Date  . Pacemaker placement    . Cardiac surgery      Family History  Problem Relation Age of Onset  . Heart failure Mother   . Heart failure Father     Social  History:  reports that he quit smoking about 3 years ago. He does not have any smokeless tobacco history on file. His alcohol and drug histories are not on file. he lives at an assisted care facility.  He is able to care for all of his activities of daily living per his report.  Allergies:  Allergies  Allergen Reactions  . Aspirin Other (See Comments)    Reaction:  Unknown     Medications Prior to Admission  Medication Sig Dispense Refill  . acetaminophen (TYLENOL) 325 MG tablet Take 975 mg by mouth 3 (three) times daily.     Marland Kitchen allopurinol (ZYLOPRIM) 100 MG tablet Take 100 mg by mouth daily.    . Calcium Carbonate-Vitamin D (CALCIUM 600+D) 600-400 MG-UNIT per tablet Take 1 tablet by mouth 2 (two) times daily with a meal.    . finasteride (PROSCAR) 5 MG tablet Take 5 mg by mouth daily.    Marland Kitchen guaiFENesin (ROBITUSSIN) 100 MG/5ML liquid Take 100 mg by mouth 2 (two) times daily as needed for cough.     . magnesium oxide (MAG-OX) 400 MG tablet Take 400 mg by mouth daily.    . Melatonin 3 MG TABS Take 3 mg by mouth at bedtime.    . metoprolol succinate (TOPROL-XL) 25 MG 24 hr tablet Take 12.5 mg by mouth daily.    Marland Kitchen oxyCODONE (OXY IR/ROXICODONE) 5 MG immediate release tablet Take 2.5 mg by mouth every 4 (  four) hours as needed for severe pain.    Marland Kitchen senna-docusate (SENOKOT-S) 8.6-50 MG tablet Take 1 tablet by mouth daily.    . simvastatin (ZOCOR) 10 MG tablet Take 5 mg by mouth at bedtime.    Marland Kitchen warfarin (COUMADIN) 1 MG tablet Take 1 mg by mouth at bedtime. Pt only takes on Friday.    . warfarin (COUMADIN) 2 MG tablet Take 2 mg by mouth at bedtime. Pt takes on Sunday, Monday, Tuesday, Wednesday, Thursday, and Saturday.      Review of Systems: GENERAL:  Fatigue.  No fevers or sweats.  Weight loss of 10 pounds (unspecified period of time). PERFORMANCE STATUS (ECOG):  3 HEENT:  No visual changes, runny nose, sore throat, mouth sores or tenderness. Lungs: No shortness of breath or cough.  No  hemoptysis. Cardiac:  No chest pain, palpitations, orthopnea, or PND. GI:  No nausea, vomiting, diarrhea, constipation, melena or hematochezia. GU:  No urgency, frequency, dysuria, or hematuria. Musculoskeletal:  No back pain.  No joint pain.  No muscle tenderness. Extremities:  No pain or swelling. Skin:  No rashes or skin changes. Neuro:  No headache, numbness or weakness, balance or coordination issues. Endocrine:  No diabetes, thyroid issues, hot flashes or night sweats. Psych:  No mood changes, depression or anxiety. Pain:  No focal pain. Review of systems:  All other systems reviewed and found to be negative.  Physical Exam:  Blood pressure 152/89, pulse 90, temperature 98.2 F (36.8 C), temperature source Oral, resp. rate 22, height 5' 7"  (1.702 m), weight 121 lb 6.4 oz (55.067 kg), SpO2 98 %.  GENERAL:  Thin elderly gentleman lying comfortably on the medical unit in no acute distress. MENTAL STATUS:  Alert and oriented to person, place and time. HEAD:  Pearline Cables hair.  Normocephalic, atraumatic, face symmetric, no Cushingoid features. EYES:  Glasses.  Blue eyes.  Pupils equal round and reactive to light and accomodation.  No conjunctivitis or scleral icterus. ENT:  Oropharynx clear without lesion.  Dentures.  Tongue normal. Mucous membranes moist.  RESPIRATORY:  Clear to auscultation without rales, wheezes or rhonchi. CARDIOVASCULAR:  Regular rate and rhythm without murmur, rub or gallop. ABDOMEN:  Soft, non-tender, with active bowel sounds, and no hepatosplenomegaly.  No masses. SKIN:  Right upper extremity skin abrasion with ecchymosis s/p fall.  1.5 cm bulbous lesion anterior right pinnae.   EXTREMITIES: Thin.  No edema, no skin discoloration or tenderness.  No palpable cords. LYMPH NODES: No palpable cervical, supraclavicular, axillary or inguinal adenopathy  NEUROLOGICAL: Unremarkable. PSYCH:  Appropriate.  Results for orders placed or performed during the hospital encounter of  05/31/15 (from the past 48 hour(s))  CBC with Differential     Status: Abnormal   Collection Time: 05/31/15  4:18 PM  Result Value Ref Range   WBC 10.6 3.8 - 10.6 K/uL   RBC 3.89 (L) 4.40 - 5.90 MIL/uL   Hemoglobin 12.2 (L) 13.0 - 18.0 g/dL   HCT 37.6 (L) 40.0 - 52.0 %   MCV 96.4 80.0 - 100.0 fL   MCH 31.2 26.0 - 34.0 pg   MCHC 32.4 32.0 - 36.0 g/dL   RDW 13.0 11.5 - 14.5 %   Platelets 274 150 - 440 K/uL   Neutrophils Relative % 79 %   Neutro Abs 8.3 (H) 1.4 - 6.5 K/uL   Lymphocytes Relative 12 %   Lymphs Abs 1.3 1.0 - 3.6 K/uL   Monocytes Relative 7 %   Monocytes Absolute 0.7 0.2 -  1.0 K/uL   Eosinophils Relative 2 %   Eosinophils Absolute 0.2 0 - 0.7 K/uL   Basophils Relative 0 %   Basophils Absolute 0.0 0 - 0.1 K/uL  Basic metabolic panel     Status: Abnormal   Collection Time: 05/31/15  4:18 PM  Result Value Ref Range   Sodium 141 135 - 145 mmol/L   Potassium 4.5 3.5 - 5.1 mmol/L   Chloride 100 (L) 101 - 111 mmol/L   CO2 30 22 - 32 mmol/L   Glucose, Bld 112 (H) 65 - 99 mg/dL   BUN 41 (H) 6 - 20 mg/dL   Creatinine, Ser 1.41 (H) 0.61 - 1.24 mg/dL   Calcium 10.3 8.9 - 10.3 mg/dL   GFR calc non Af Amer 44 (L) >60 mL/min   GFR calc Af Amer 50 (L) >60 mL/min    Comment: (NOTE) The eGFR has been calculated using the CKD EPI equation. This calculation has not been validated in all clinical situations. eGFR's persistently <60 mL/min signify possible Chronic Kidney Disease.    Anion gap 11 5 - 15  Protime-INR     Status: Abnormal   Collection Time: 05/31/15  4:18 PM  Result Value Ref Range   Prothrombin Time 27.5 (H) 11.4 - 15.0 seconds   INR 2.60   APTT     Status: Abnormal   Collection Time: 05/31/15  4:18 PM  Result Value Ref Range   aPTT 45 (H) 24 - 36 seconds    Comment:        IF BASELINE aPTT IS ELEVATED, SUGGEST PATIENT RISK ASSESSMENT BE USED TO DETERMINE APPROPRIATE ANTICOAGULANT THERAPY.   Troponin I     Status: Abnormal   Collection Time: 05/31/15   4:18 PM  Result Value Ref Range   Troponin I 0.04 (H) <0.031 ng/mL    Comment: READ BACK AND VERIFIED WITH ASHLEY SMITH AT 1853 05/31/15 MLZ        PERSISTENTLY INCREASED TROPONIN VALUES IN THE RANGE OF 0.04-0.49 ng/mL CAN BE SEEN IN:       -UNSTABLE ANGINA       -CONGESTIVE HEART FAILURE       -MYOCARDITIS       -CHEST TRAUMA       -ARRYHTHMIAS       -LATE PRESENTING MYOCARDIAL INFARCTION       -COPD   CLINICAL FOLLOW-UP RECOMMENDED.   Troponin I     Status: Abnormal   Collection Time: 06/01/15 12:18 AM  Result Value Ref Range   Troponin I 0.05 (H) <0.031 ng/mL    Comment: PREVIOUS RESULT CALLED ASHLEY SMITH AT 1853 ON 05/31/15 BY MLZ. VAB        PERSISTENTLY INCREASED TROPONIN VALUES IN THE RANGE OF 0.04-0.49 ng/mL CAN BE SEEN IN:       -UNSTABLE ANGINA       -CONGESTIVE HEART FAILURE       -MYOCARDITIS       -CHEST TRAUMA       -ARRYHTHMIAS       -LATE PRESENTING MYOCARDIAL INFARCTION       -COPD   CLINICAL FOLLOW-UP RECOMMENDED.   Troponin I     Status: Abnormal   Collection Time: 06/01/15  7:17 AM  Result Value Ref Range   Troponin I 0.04 (H) <0.031 ng/mL    Comment: PREVIOUS RESULT CALLED @1853  ON 05/31/15 BY MLZ.Marland KitchenHKP        PERSISTENTLY INCREASED TROPONIN VALUES IN THE RANGE OF 0.04-0.49 ng/mL CAN  BE SEEN IN:       -UNSTABLE ANGINA       -CONGESTIVE HEART FAILURE       -MYOCARDITIS       -CHEST TRAUMA       -ARRYHTHMIAS       -LATE PRESENTING MYOCARDIAL INFARCTION       -COPD   CLINICAL FOLLOW-UP RECOMMENDED.   Basic metabolic panel     Status: Abnormal   Collection Time: 06/01/15  7:17 AM  Result Value Ref Range   Sodium 140 135 - 145 mmol/L    Comment: ELECTROLYTES REPETED.Marland KitchenMarland KitchenHKP   Potassium 4.3 3.5 - 5.1 mmol/L   Chloride 107 101 - 111 mmol/L   CO2 31 22 - 32 mmol/L   Glucose, Bld 104 (H) 65 - 99 mg/dL   BUN 32 (H) 6 - 20 mg/dL   Creatinine, Ser 1.35 (H) 0.61 - 1.24 mg/dL   Calcium 9.1 8.9 - 10.3 mg/dL   GFR calc non Af Amer 46 (L) >60 mL/min    GFR calc Af Amer 53 (L) >60 mL/min    Comment: (NOTE) The eGFR has been calculated using the CKD EPI equation. This calculation has not been validated in all clinical situations. eGFR's persistently <60 mL/min signify possible Chronic Kidney Disease.    Anion gap 2 (L) 5 - 15  CBC     Status: Abnormal   Collection Time: 06/01/15  7:17 AM  Result Value Ref Range   WBC 7.5 3.8 - 10.6 K/uL   RBC 3.39 (L) 4.40 - 5.90 MIL/uL   Hemoglobin 10.9 (L) 13.0 - 18.0 g/dL   HCT 33.2 (L) 40.0 - 52.0 %   MCV 97.9 80.0 - 100.0 fL   MCH 32.3 26.0 - 34.0 pg   MCHC 33.0 32.0 - 36.0 g/dL   RDW 12.7 11.5 - 14.5 %   Platelets 232 150 - 440 K/uL  Troponin I     Status: Abnormal   Collection Time: 06/01/15  1:31 PM  Result Value Ref Range   Troponin I 0.05 (H) <0.031 ng/mL    Comment: PREVIOUS RESULT CALLED @1853  ON 05/31/15 BY MLZ.Marland KitchenHKP        PERSISTENTLY INCREASED TROPONIN VALUES IN THE RANGE OF 0.04-0.49 ng/mL CAN BE SEEN IN:       -UNSTABLE ANGINA       -CONGESTIVE HEART FAILURE       -MYOCARDITIS       -CHEST TRAUMA       -ARRYHTHMIAS       -LATE PRESENTING MYOCARDIAL INFARCTION       -COPD   CLINICAL FOLLOW-UP RECOMMENDED.    Dg Chest 2 View  05/31/2015  CLINICAL DATA:  Chest pain following a fall today.  Ex-smoker. EXAM: CHEST  2 VIEW COMPARISON:  12/26/2014. FINDINGS: Stable enlarged cardiac silhouette, post CABG changes and left subclavian pacer and AICD leads. Stable mild linear scarring at the left lung base. Clear right lung. Normal vascularity. Diffuse osteopenia. No fracture pneumothorax seen. IMPRESSION: No acute abnormality. Electronically Signed   By: Claudie Revering M.D.   On: 05/31/2015 16:05   Ct Head Wo Contrast  05/31/2015  CLINICAL DATA:  Pain following fall EXAM: CT HEAD WITHOUT CONTRAST CT CERVICAL SPINE WITHOUT CONTRAST TECHNIQUE: Multidetector CT imaging of the head and cervical spine was performed following the standard protocol without intravenous contrast. Multiplanar  CT image reconstructions of the cervical spine were also generated. COMPARISON:  None. FINDINGS: CT HEAD FINDINGS There is moderate diffuse atrophy. There  is no intracranial mass, hemorrhage, extra-axial fluid collection, or midline shift. There is moderate small vessel disease in the centra semiovale bilaterally. There are prior small infarcts in the genu of each right internal capsule. Small infarct on the right also extends into the medial posterior right putamen. There is small vessel disease in the pons bilaterally in the basilar perforator distribution. No acute infarct is evident. The bony calvarium appears intact. The mastoid air cells are clear. No intraorbital lesions are appreciable. There is opacification of multiple ethmoid air cells bilaterally as well as the right maxillary antrum. CT CERVICAL SPINE FINDINGS There is levoscoliosis. No fracture is evident. There is 3 mm of anterolisthesis of C4 on C5. There is 2 mm of retrolisthesis of C5 on C6. There is minimal anterolisthesis of C7 on T1. These areas of spondylolisthesis are felt to be due to underlying spondylosis. The prevertebral soft tissues and predental space regions are normal. Bones are osteoporotic. There is multifocal facet arthropathy. Exit foraminal narrowing is noted at multiple levels, most pronounced at C4-5 on the right and C5-6 bilaterally. There is no frank disc extrusion or high-grade stenosis. There are foci of carotid artery calcification bilaterally. There is also calcification in the visualized aortic arch region. There are several healed posterior right rib fractures which do not appear acute. IMPRESSION: CT head: Atrophy with prior small infarcts and small vessel disease. No intracranial mass, hemorrhage, or extra-axial fluid collection. No acute infarct evident. Multifocal paranasal sinus disease is present. CT cervical spine: No demonstrable fracture. Scoliosis with several areas of mild spondylolisthesis, felt to be due to  underlying spondylosis. There is extensive multifocal osteoarthritic change. Facet hypertrophy overall tends to be more severe on the right than on the left. There are foci of carotid artery calcification bilaterally. Several old right posterior rib fractures which appear healed. Electronically Signed   By: Lowella Grip III M.D.   On: 05/31/2015 16:16   Ct Chest W Contrast  05/31/2015  CLINICAL DATA:  Fall EXAM: CT CHEST WITH CONTRAST TECHNIQUE: Multidetector CT imaging of the chest was performed during intravenous contrast administration. CONTRAST:  89m OMNIPAQUE IOHEXOL 300 MG/ML  SOLN COMPARISON:  None. FINDINGS: Normal thyroid No evidence of mediastinal hemorrhage. No abnormal mediastinal adenopathy. Ascending aortic maximal caliber is 4.1 cm. Scattered atherosclerotic changes are noted within the great vessels. No evidence of aortic dissection or intramural hematoma. No evidence of aortic injury. Left subclavian pacemaker device in place.  Status post CABG. No pneumothorax or pleural effusion. Hyperaeration. Scattered linear atelectasis. 1.1 x 2.3 cm spiculated superior segment left lower lobe lung masses worrisome for malignancy. No pneumothorax or pleural effusion. Subacute right rib fractures on the right are present on images 19, 26, and 32. Acute right rib fractures laterally on image 33, 40, and 43. Chronic additional right rib deformities. Subacute posterior right rib fracture on image number 40. Acute right rib fracture on image 47 posteriorly. There are compression fractures at T12, T5, T3, and T4. There is 50% loss of height at T12. Mild loss of height occurs at T4 and T5. 30% loss of height anteriorly at T3. There is no obvious retropulsion at these fractures. Benign appearing cyst in the liver. Calcified granulomata in the liver. Renal cysts. IMPRESSION: There are acute and subacute right-sided rib fractures Multiple thoracic compression deformities of indeterminate age. MRI can be  performed to further delineate. 2.3 cm mass in the superior segment of the left lower lobe is worrisome for malignancy. PET-CT is recommended to  further characterize. Ascending aorta is 4.1 cm in caliber. Recommend annual imaging followup by CTA or MRA. This recommendation follows 2010 ACCF/AHA/AATS/ACR/ASA/SCA/SCAI/SIR/STS/SVM Guidelines for the Diagnosis and Management of Patients with Thoracic Aortic Disease. Circulation. 2010; 121: H631-S970 Electronically Signed   By: Marybelle Killings M.D.   On: 05/31/2015 18:43   Ct Cervical Spine Wo Contrast  05/31/2015  : CT head and CT cervical spine reports are combined into a single dictation. Electronically Signed   By: Lowella Grip III M.D.   On: 05/31/2015 16:16   Ct Lumbar Spine Wo Contrast  05/31/2015  CLINICAL DATA:  Pain status post fall. EXAM: CT LUMBAR SPINE WITHOUT CONTRAST TECHNIQUE: Multidetector CT imaging of the lumbar spine was performed without intravenous contrast administration. Multiplanar CT image reconstructions were also generated. COMPARISON:  None. FINDINGS: There is a 12 mm right lower lobe subpleural mass versus area of round atelectasis. There is fusiform dilation of the abdominal aorta with maximum transverse diameter of 3.2 cm, on the background of advanced calcified atherosclerotic disease. There is residual contrast opacification of bilateral renal collecting systems. 3 mm vascular calcification versus nonobstructing right renal calculus is seen. There is a 1.6 cm left renal cyst. There is diffuse osteopenia. There is superior endplate compression deformity of T12 vertebral body with approximately 50% height loss, without evidence of acute fracture line. There is milder compression deformity of L4 vertebral body with approximately 30% height loss. There is are moderate osteoarthritic changes of the lumbosacral spine, with posterior facet arthropathy, more significant in the lower lumbosacral spine, at L4-L5 and L5-S1. There is  curvilinear sclerotic line through the right sacrum, which may represent a subacute fracture. Posterior right T11 and T12 probably subacute fractures are seen. IMPRESSION: Diffuse osteopenia. Superior endplate compression deformity of T12 vertebral body with approximately 50% height loss, likely subacute. Superior endplate compression deformity of L4 vertebral body with approximate 30% height loss, likely subacute. Curvilinear sclerotic line through the right sacrum may represent an old sacral fracture. Posterior right T11 and T12 probably subacute rib fractures. Moderate osteoarthritic changes of the lumbosacral spine, with posterior facet arthropathy, most significant in the lower lumbosacral spine. Fusiform dilation of the proximal abdominal aorta with maximum diameter of 3.2 cm. Recommend followup by ultrasound in 3 years. This recommendation follows ACR consensus guidelines: White Paper of the ACR Incidental Findings Committee II on Vascular Findings. J Am Coll Radiol 2013; 26:378-588 12 mm right lower lobe subpleural soft tissue thickening which may represent an area of round atelectasis or true pulmonary mass. Attention on future follow-up is recommended. Electronically Signed   By: Fidela Salisbury M.D.   On: 05/31/2015 20:25    Assessment:  The patient is a 82 y.o. gentleman with 25 pack year smoking history presents with a 2.3 cm left lower lobe spiculated nodule worrisome for primary lung cancer.  He has diffuse osteopenia s/p fall and fractures.  He is anemic.    Patient was recently living at an assisted living facility.  He would like to receive his care at the [facility] in [place].   Plan:   1.  Discuss current imaging studies.  Discuss concern for possible malignancy (lung cancer or metastatic disease).  Suspect primary lung cancer.  Discuss consideration of outpatient PET scan.  Discuss biopsy.  Doubt patient is a candidate for surgery if early lung cancer.   2.  Anemia workup:   ferritin, iron studies, B12, folate, SPEP. 3.  Anticipate outpatient evaluation.   Thank you for allowing  me to participate in Daniel Huff 's care.  I will follow him closely with you while hospitalized and after discharge in the outpatient department.  Lequita Asal, MD  06/01/2015, 7:34 PM

## 2015-06-01 NOTE — Clinical Social Work Note (Signed)
Clinical Social Work Assessment  Patient Details  Name: Daniel Huff MRN: [id number] Date of Birth: Mar 11, 1927  Date of referral:  06/01/15               Reason for consult:  Discharge Planning                Permission sought to share information with:  Family Supports, Guardian Permission granted to share information::  Yes, Verbal Permission Granted  Name::        Agency::   [facility])  Relationship::   Donella Stade (Daughter) [phone]; Eduard Roux ([place]) [phone])  Contact Information:     Housing/Transportation Living arrangements for the past 2 months:  [facility]) Source of Information:  Patient Patient Interpreter Needed:  None Criminal Activity/Legal Involvement Pertinent to Current Situation/Hospitalization:  No - Comment as needed Significant Relationships:  Adult Children Lives with:  Facility Resident [facility]) Do you feel safe going back to the place where you live?  Yes Need for family participation in patient care:  Yes (Comment) Myrtis Hopping Conservation officer, historic buildings ([place] [phone])  Care giving concerns:  Patient is a resident at [facility].    Social Worker assessment / plan:  CSW was informed by RN that patient is a resident at a group home. CSW met with patient at bedside; he was alert and oriented; eating in bed. Per patient he is a resident at [facility]. CSW explained her role. Per patient he uses a rollator to get around. He reports that he's had two falls in the past week because the rollator has moved when he went to sitting down. CSW inquired about patient's feelings surrounding going to SNF at discharge for STR. Patient reports that he did not want to. CSW gained verbal permission to call patient's daughter, [facility]. Patient reports that he prefers to get [facility] PT. CSW informed RN Case  Manager.  CSW contacted patient's daughter Genice Rouge [phone]. No answer. Patient left voicemail. Awaiting call back.  CSW contacted [facility]. Was informed that patient had fallen two times this week. Reports that he uses a rollator and has some issues with his knee and hip. CSW was informed that patient has a legal guardian through [employer].   CSW left a voicemail form Levander Campion [phone]). Awaiting phone call back.   CSW placed FYI on patient's chart to include Legal Guardian information and add Legal Guardian information to patient's face sheet.   CSW will continue to follow and assist.    Employment status:  Retired Forensic scientist:  Medicare PT Recommendations:  [place] / Referral to community resources:  [place]  Patient/Family's Response to care:  Patient reports that he'll return to [facility]. CSW will confirm plans with patient's legal guardian, Eduard Roux [phone].   Patient/Family's Understanding of and Emotional Response to Diagnosis, Current Treatment, and Prognosis:  Patient understands CSW's role. He reports he can make his own decision. He reports that he wants to go home.   Emotional Assessment Appearance:  Appears stated age Attitude/Demeanor/Rapport:   (None ) Affect (typically observed):  Accepting, Calm, Pleasant Orientation:  Oriented to Self, Oriented to Place, Oriented to Situation Alcohol / Substance use:  Not Applicable Psych involvement (Current and /or in the community):  No (Comment)  Discharge Needs  Concerns  to be addressed:  Discharge Planning Concerns Readmission within the last 30 days:  No Current discharge risk:  Chronically ill Barriers to Discharge:  Continued Medical Work up   [employer], LCSW 06/01/2015, 3:28 PM

## 2015-06-01 NOTE — ED Notes (Signed)
Patient observed resting in room with NAD noted. Patient pending bed assignment at this time. Continues to show a paced rhythm with frequent PVCs on the monitor. No verbalized needs or acute needs identified at this time. Will continue to monitor.

## 2015-06-01 NOTE — Progress Notes (Signed)
Spoke with dr. Vianne Bulls to clarify order to stop ns at 89m/hr. Per md okay to discontinue fluids.

## 2015-06-01 NOTE — Evaluation (Signed)
Occupational Therapy Evaluation Patient Details Name: Boaz Berisha MRN: [id number] DOB: 08-Jan-1927 Today's Date: 06/01/2015    History of Present Illness This patient is an 82 year old male who came to [facility]  with multiple falls recently. Patient came in after fall at his group home. Chart review shows that he was putting clothes away when he fell backwards, and tripped over his walker. He states that he had a fall about 2 weeks ago, which was also similar, and involved his walker as well. Today he complains of significant right-sided chest pain. Imaging shows some rib fractures. CT also revealed thoracic and lumbar subacute compression fractures. Patient is experiencing very shallow breathing due to his pain, which is leading to some mild hypoxia as well. Hospitalist were called for admission for pain control and monitoring. Of note, his CT scan also showed some lung lesions. Specifically he had a left lower lobe spiculated lung lesion which needs follow-up imaging and evaluation.   Clinical Impression   This patient is an 82 year old male who came to [facility] with the above history. He lives in a group home with a ramp entrance (one story) and had been independent with basic activities of daily living such as dressing, bathing, toileting and feeding. He has deficits today with pain, mobility, and activities of daily living and would benefit from Occupational Therapy for ADL/functional mobility training.    Follow Up Recommendations  SNF    Equipment Recommendations       Recommendations for Other Services       Precautions / Restrictions Precautions Precautions: Fall Precaution Comments: high Restrictions Weight Bearing Restrictions: No      Mobility Bed Mobility  Transfers            Balance       Standing balance support: Bilateral upper extremity supported;During functional activity Standing balance-Leahy  Scale: Poor Standing balance comment: Poor, with decrased balance reactions due to pain                            ADL                                         General ADL Comments: Patient had been independent with basic ADL of dressing , bathing, toileting and eating. Now he will need moderate to max assist for dressing and needs assist of 2 for transfers (as per Physical Therapy) During this session, ate part of his lunch and was independent once set up and food cut, with some spillage.      Vision     Perception     Praxis      Pertinent Vitals/Pain Pain Assessment: 0-10 Pain Location: low back and shoulder  Pain Descriptors / Indicators: Aching;Sore Pain Intervention(s): Limited activity within patient's tolerance;Monitored during session     Hand Dominance Right   Extremity/Trunk Assessment Upper Extremity Assessment Upper Extremity Assessment:  (Patient upper extremity range of motion is [facility] but causes pain. Bilateral strength is 4/5.)    Lower Extremity Assessment Lower Extremity Assessment: Defer to PT evaluation       Communication Communication Communication: No difficulties   Cognition Arousal/Alertness: Awake/alert Behavior During Therapy: WFL for tasks assessed/performed Overall Cognitive Status: Within Functional Limits for tasks assessed  General Comments       Exercises       Shoulder Instructions      Home Living Family/patient expects to be discharged to:: Group home Living Arrangements: Group Home Available Help at Discharge: Available PRN/intermittently Type of Home: House Home Access: Ramped entrance;Level entry     Home Layout: One level     Bathroom Shower/Tub: Tub/shower unit     Bathroom Accessibility: No   Home Equipment: Walker - 4 wheels;Grab bars - toilet;Grab bars - tub/shower          Prior Functioning/Environment Level of Independence: Independent with  assistive device(s)             OT Diagnosis: Generalized weakness;Acute pain   OT Problem List:     OT Treatment/Interventions:      OT Goals(Current goals can be found in the care plan section) Acute Rehab OT Goals Patient Stated Goal: To go home. OT Goal Formulation: With patient Time For Goal Achievement: 06/15/15 Potential to Achieve Goals: Good  OT Frequency:     Barriers to D/C:            Co-evaluation              End of Session    Activity Tolerance: Patient limited by pain Patient left: in bed;with call bell/phone within reach;with bed alarm set   Time: 5456-2563 OT Time Calculation (min): 20 min Charges:  OT General Charges $OT Visit: 1 Procedure OT Evaluation $OT Eval Low Complexity: 1 Procedure G-Codes:    Myrene Galas, MS/OTR/L  06/01/2015, 1:59 PM

## 2015-06-01 NOTE — Progress Notes (Signed)
CSW received Daniel call back from patient's daughter Daniel Huff. She reports that shew as under the impression that patient was at [facility]. She stated they were supposed to call her back with his room number and information. She reports she never received Daniel call back. Verified that patient's legal guardian is Daniel Huff, Daniel Huff. Provided CSW with Daniel Huff information  CSW received call back from Daniel Huff, Legal Guardian. She reports that she's patient's legal guardian. Per Daniel Huff patient receives services from [facility]. She stated that he's had to do STR there before. Provided Daniel Huff, Daniel Huff. She reports that that STR would be coordinated through Daniel Huff. Daniel Huff requested CSW to update her with patient information.   CSW called Daniel Huff with [facility] and left voicemail. Awaiting call back.  CSW will continue to follow and assist.   Ernest Pine, MSW, [place] Work Department [phone]

## 2015-06-01 NOTE — Progress Notes (Signed)
Initial Nutrition Assessment  DOCUMENTATION CODES:   Severe malnutrition in context of chronic illness  INTERVENTION:  Meals and snacks: Recommend liberalizing diet to regular. Pt reports food has no taste and can't eat it Medical Nutrition Supplement Therapy: Will add mightyshake TID for added nutrition   NUTRITION DIAGNOSIS:   Malnutrition related to cancer and cancer related treatments as evidenced by moderate depletion of body fat, moderate depletions of muscle mass, severe depletion of muscle mass, percent weight loss.    GOAL:   Patient will meet greater than or equal to 90% of their needs    MONITOR:    (Energy intake, Electrolyte and renal profile)  REASON FOR ASSESSMENT:   Malnutrition Screening Tool    ASSESSMENT:      Pt admitted with mulitple falls, lung mass found  Past Medical History  Diagnosis Date  . PVD (peripheral vascular disease) ([place])   . Hypertension   . DJD (degenerative joint disease)   . Ventricular tachycardia (paroxysmal) ([place])   . BPH (benign prostatic hyperplasia)   . Cardiomyopathy ([place])     Current Nutrition: ate 1/2 of [place] today and 100% of corn  Food/Nutrition-Related History: reports good appetite at long term care.  Eats toast, cereal in the am, meat and vegetables for lunch and sandwich for dinner.  Typically skips supper per pt because "it is not good"   Scheduled Medications:  . finasteride  5 mg Oral Daily  . lidocaine  1 patch Transdermal Q24H  . metoprolol succinate  12.5 mg Oral Daily  . sodium chloride flush  3 mL Intravenous Q12H  . [START ON 06/02/2015] warfarin  1 mg Oral Once per day on Fri  . warfarin  2 mg Oral Once per day on Sun Mon Tue Wed Thu Sat  . Warfarin - Pharmacist Dosing Inpatient   Does not apply q1800         Electrolyte/Renal Profile and Glucose Profile:   Recent Labs Lab 05/31/15 1618 06/01/15 0717  NA 141 140  K 4.5 4.3  CL 100* 107  CO2 30 31  BUN 41* 32*  CREATININE  1.41* 1.35*  CALCIUM 10.3 9.1  GLUCOSE 112* 104*      Nutrition-Focused Physical Exam Findings: Nutrition-Focused physical exam completed. Findings are moderatefat depletion, moderate to severe muscle depletion, and none edema.      Weight Change: 14% wt loss in the last 5 months     Diet Order:  Diet Heart Room service appropriate?: Yes; Fluid consistency:: Thin  Skin:   reviewed   Height:   Ht Readings from Last 1 Encounters:  05/31/15 '5\' 7"'$  (1.702 m)    Weight:   Wt Readings from Last 1 Encounters:  06/01/15 121 lb 6.4 oz (55.067 kg)    Ideal Body Weight:     BMI:  Body mass index is 19.01 kg/(m^2).  Estimated Nutritional Needs:   Kcal:  BEE 1188 kcals (IF 1.0-1.2, AF 1.3) 6789-3810 kcals/d.   Protein:  (1.1-1.3 g/kg) 61-72 g/d  Fluid:  (25-11m/kg) 1375-16566md  EDUCATION NEEDS:   No education needs identified at this time  HIGH Care Level  Floriene Jeschke B. AlZenia ResidesRDDunkirkLDSaddle Rock Estatespager) Weekend/On-Call pager ([phone]

## 2015-06-01 NOTE — ED Notes (Signed)
RN in to obtain MD ordered repeat Troponin. Patient updated on POC as it stands at this time while RN in room. Patient with NAD noted; VSS. Patient continues to show a paced rhythm with frequent PVCs on the monitor. Patient with no verbalized needs at this time. Will continue to monitor.

## 2015-06-01 NOTE — Progress Notes (Signed)
ANTICOAGULATION CONSULT NOTE - Initial Consult  Pharmacy Consult for warfarin dosing Indication: mechanical aortic valve  Allergies  Allergen Reactions  . Aspirin Other (See Comments)    Reaction:  Unknown     Patient Measurements: Height: '5\' 7"'$  (170.2 cm) Weight: 121 lb 6.4 oz (55.067 kg) IBW/kg (Calculated) : 66.1 Heparin Dosing Weight: n/a  Vital Signs: Temp: 98.2 F (36.8 C) (01/26 0301) Temp Source: Oral (01/26 0301) BP: 165/81 mmHg (01/26 0301) Pulse Rate: 73 (01/26 0301)  Labs:  Recent Labs  05/31/15 1618 06/01/15 0018  HGB 12.2*  --   HCT 37.6*  --   PLT 274  --   APTT 45*  --   LABPROT 27.5*  --   INR 2.60  --   CREATININE 1.41*  --   TROPONINI 0.04* 0.05*    Estimated Creatinine Clearance: 29.3 mL/min (by C-G formula based on Cr of 1.41).   Medical History: Past Medical History  Diagnosis Date  . PVD (peripheral vascular disease) ([place])   . Hypertension   . DJD (degenerative joint disease)   . Ventricular tachycardia (paroxysmal) ([place])   . BPH (benign prostatic hyperplasia)   . Cardiomyopathy ([place])     Medications:    Assessment: Hgb 12.2  INR 2.60  Goal of Therapy:  INR 2-3 Monitor platelets by anticoagulation protocol: Yes   Plan:  Continue home regimen of 1 mg on Friday with 2 mg all other days of the week. F/u INR in AM. Goal INR 2-3 for mechanical aortic valve.  Markale Birdsell S 06/01/2015,3:42 AM

## 2015-06-01 NOTE — Progress Notes (Signed)
Patient was admitted from ER via a stretcher following a fall at home. Patient's skin was checked with Lajean Manes  RN. Patient has preadmission redness on his sacrum; foam dressing applied. Patient also have some bruises on his right shoulder and right elbow, both bruises were cleaned with normal saline. Patient c/o pain was relieved with PRN pain med. Patient slept for most of the night and remained AV paced in the 70.

## 2015-06-01 NOTE — ED Notes (Signed)
Spoke with Jannifer Franklin, MD regarding bed placement for this patient. MD with [facility] to change patient from med/surg to telemetry. Supervisor made aware.

## 2015-06-01 NOTE — Progress Notes (Signed)
Physical Therapy Evaluation Patient Details Name: Daniel Huff MRN: [id number] DOB: 26-Dec-1926 Today's Date: 06/01/2015   History of Present Illness  Daniel Huff is a 82 y.o. male who presents with multiple falls recently. Patient came in today after fall at home. He states that he was putting clothes away when he fell backwards, and tripped over his walker. He states that he had a fall about 2 weeks ago, which was also similar, and involved his walker as well. Today he complains of significant right-sided chest pain. Imaging shows some rib fractures. CT also revealed thoracic and lumbar subacute compression fractures. Patient is experiencing very shallow breathing due to his pain, which is leading to some mild hypoxia as well. Hospitals were called for admission for pain control and monitoring. Of note, his CT scan also showed some lung lesions. Specifically he had a left lower lobe spiculated lung lesion which needs follow-up imaging and evaluation.  Clinical Impression  Pt presents with pain in back, shoulders, and ribs; decreased strength, and decreased functional mobility including bed mobility, transfers, and ambulation and would benefit from acute PT services to address objective findings.  Pt is unable to tolerate HOB flat for [place] periods of time and required +2 assistance to scoot up in bed.  Pt was able to stand briefly at EOB and weight shift for a couple steps alongside bed only.  Pt would benefit from f/u services 5x/week at discharge in order no but point to return to PLOF and return to group home.    Follow Up Recommendations SNF    Equipment Recommendations  Rolling walker with 5" wheels;Hospital bed    Recommendations for Other Services       Precautions / Restrictions Precautions Precautions: Fall Precaution Comments: high Restrictions Weight Bearing Restrictions: No      Mobility  Bed Mobility Overal bed mobility: Needs Assistance;+2 for physical  assistance Bed Mobility: Supine to Sit;Sit to Supine     Supine to sit: Min assist;HOB elevated (bed rails exiting to L side) Sit to supine: Min guard (to get up into bed)   General bed mobility comments: +2 assist to scoot up in bed; attempted using bridging, but unable to move upwards.  Transfers Overall transfer level: Needs assistance Equipment used: Rolling walker (2 wheeled) Transfers: Sit to/from Stand Sit to Stand: Min assist;From elevated surface         General transfer comment: Sit<>stand with 2 attempts, able to get into standing on second attempt and balance self with CGA using RW  Ambulation/Gait Ambulation/Gait assistance: Min assist Ambulation Distance (Feet): 2 Feet Assistive device: Rolling walker (2 wheeled) Gait Pattern/deviations: Decreased weight shift to right;Decreased weight shift to left     General Gait Details: Side-stepping along bed for 2 steps with Min A for balance and safety.  Stairs            Wheelchair Mobility    Modified Rankin (Stroke Patients Only)       Balance Overall balance assessment: Needs assistance Sitting-balance support: Feet supported       Standing balance support: Bilateral upper extremity supported;During functional activity Standing balance-Leahy Scale: Poor Standing balance comment: Poor, with decrased balance reactions due to pain                             Pertinent Vitals/Pain Pain Assessment: 0-10 Pain Location: low back and shoulder (both but more in right) Pain Descriptors / Indicators: Sore;Aching;Discomfort Pain Intervention(s):  Limited activity within patient's tolerance;Monitored during session    [place] expects to be discharged to:: Group home Living Arrangements: Group Home Available Help at Discharge: Available PRN/intermittently Type of Home: House Home Access: Ramped entrance;Level entry     Home Layout: One level Home Equipment: Walker - 4  wheels;Grab bars - toilet;Grab bars - tub/shower      Prior Function Level of Independence: Independent with assistive device(s)               Hand Dominance   Dominant Hand: Right    Extremity/Trunk Assessment   Upper Extremity Assessment: RUE deficits/detail;LUE deficits/detail RUE Deficits / Details: Shoulder pain with movement limiting shoulder range of motion; able to use arms for bed mobility; R side affected more than L     LUE Deficits / Details: Shoulder pain with movement limiting shoulder range of motion; able to use arms for bed mobility; R side affected more than L   Lower Extremity Assessment: Generalized weakness         Communication   Communication: No difficulties  Cognition Arousal/Alertness: Awake/alert Behavior During Therapy: WFL for tasks assessed/performed Overall Cognitive Status: Within Functional Limits for tasks assessed                      General Comments      Exercises        Assessment/Plan    PT Assessment Patient needs continued PT services  PT Diagnosis Difficulty walking;Generalized weakness;Acute pain   PT Problem List Decreased strength;Decreased range of motion;Decreased activity tolerance;Decreased balance;Decreased mobility;Decreased safety awareness;Pain  PT Treatment Interventions DME instruction;Gait training;Functional mobility training;Therapeutic activities;Therapeutic exercise;Balance training;Patient/family education   PT Goals (Current goals can be found in the Care Plan section) Acute Rehab PT Goals Patient Stated Goal: To go home. PT Goal Formulation: With patient Time For Goal Achievement: 06/15/15 Potential to Achieve Goals: Good    Frequency Min 2X/week   Barriers to discharge        Co-evaluation               End of Session Equipment Utilized During Treatment: Gait belt Activity Tolerance: Patient limited by pain Patient left: in bed;with call bell/phone within reach;with bed  alarm set Nurse Communication: Mobility status         Time: 5146-0479 PT Time Calculation (min) (ACUTE ONLY): 26 min   Charges:   PT Evaluation $PT Eval Moderate Complexity: 1 Procedure     PT G Codes:        Jhordyn Hoopingarner A Adley Castello 06/03/15, 11:03 AM

## 2015-06-01 NOTE — ED Notes (Signed)
House supervisor called to ask about patient being moved to telemetry rather than the assigned med/surg bed. This RN to speak with admitting MD.

## 2015-06-01 NOTE — NC FL2 (Signed)
[place] LEVEL OF CARE SCREENING TOOL     IDENTIFICATION  Patient Name: Daniel Huff Birthdate: 02-12-1927 Sex: male Admission Date (Current Location): 05/31/2015  [place] and [place] Number:  Engineering geologist and Address:  [facility], [address] [place], [place] [address]      Provider Number: [id number]  Attending Physician Name and Address:  Epifanio Lesches, MD  Relative Name and Phone Number:       Current Level of Care: Hospital Recommended Level of Care: [place] [facility] Prior Approval Number:    Date Approved/Denied:   PASRR Number:  ([id number]  )  Discharge Plan: [facility] [id number]    Current Diagnoses: Patient Active Problem List   Diagnosis Date Noted  . Lung mass 05/31/2015  . Multiple falls 05/31/2015  . Multiple rib fractures 05/31/2015  . Compression fracture of T12 vertebra ([place]) 05/31/2015  . Compression fracture of L4 lumbar vertebra ([place]) 05/31/2015  . HTN (hypertension) 05/31/2015  . Aortic valve replaced 05/31/2015  . CAD (coronary artery disease) 05/31/2015  . BPH (benign prostatic hyperplasia) 05/31/2015    Orientation RESPIRATION BLADDER Height & Weight    Self, Situation, Place  O2 (Nasal Cannula 2 (L/min) ) Incontinent '5\' 7"'$  (170.2 cm) 121 lbs.  BEHAVIORAL SYMPTOMS/MOOD NEUROLOGICAL BOWEL NUTRITION STATUS   (None)  (None ) Incontinent Diet (Heart )  AMBULATORY STATUS COMMUNICATION OF NEEDS Skin   Extensive Assist Verbally Normal                       Personal Care Assistance Level of Assistance  Bathing, Dressing, Feeding Bathing Assistance: Limited assistance Feeding assistance: Independent Dressing Assistance: Limited assistance     Functional Limitations Info  Sight, Hearing, Speech Sight Info: Adequate Hearing Info: Adequate Speech Info: Adequate    SPECIAL CARE FACTORS FREQUENCY  PT (By licensed PT), OT (By licensed OT)     PT Frequency:   (5) OT Frequency:  (5)            Contractures      Additional Factors Info  Allergies, Code Status Code Status Info:  (Full Code) Allergies Info:  (Aspirin)           Current Medications (06/01/2015):  This is the current hospital active medication list Current Facility-Administered Medications  Medication Dose Route Frequency Provider Last Rate Last Dose  . acetaminophen (TYLENOL) tablet 650 mg  650 mg Oral Q6H PRN Lance Coon, MD       Or  . acetaminophen (TYLENOL) suppository 650 mg  650 mg Rectal Q6H PRN Lance Coon, MD      . finasteride (PROSCAR) tablet 5 mg  5 mg Oral Daily Lance Coon, MD   5 mg at 06/01/15 1007  . HYDROmorphone (DILAUDID) injection 0.5 mg  0.5 mg Intravenous Q4H PRN Lance Coon, MD      . lidocaine (LIDODERM) 5 % 1 patch  1 patch Transdermal Q24H Lance Coon, MD   1 patch at 06/01/15 [phone]  . metoprolol succinate (TOPROL-XL) 24 hr tablet 12.5 mg  12.5 mg Oral Daily Lance Coon, MD   12.5 mg at 06/01/15 1007  . ondansetron (ZOFRAN) tablet 4 mg  4 mg Oral Q6H PRN Lance Coon, MD       Or  . ondansetron [facility]) injection 4 mg  4 mg Intravenous Q6H PRN Lance Coon, MD      . oxyCODONE (Oxy IR/ROXICODONE) immediate release tablet 5 mg  5  mg Oral Q4H PRN Lance Coon, MD   5 mg at 06/01/15 0316  . sodium chloride flush (NS) 0.9 % injection 3 mL  3 mL Intravenous Q12H Lance Coon, MD   3 mL at 06/01/15 1007  . [START ON 06/02/2015] warfarin (COUMADIN) tablet 1 mg  1 mg Oral Once per day on Fri Lance Coon, MD      . warfarin (COUMADIN) tablet 2 mg  2 mg Oral Once per day on Sun Mon Tue Wed Thu Sat Lance Coon, MD      . Warfarin - Pharmacist Dosing Inpatient   Does not apply q1800 Lance Coon, MD         Discharge Medications: Please see discharge summary for a list of discharge medications.  Relevant Imaging Results:  Relevant Lab Results:   Additional Information  (SSN [id number])  Lorenso Quarry Sunkins, LCSW

## 2015-06-01 NOTE — Care Management Note (Signed)
Case Management Note  Patient Details  Name: Aveer Bartow MRN: [id number] Date of Birth: 1927/01/02  Subjective/Objective:   PT recommending SNF. Will update CSW                 Action/Plan:   Expected Discharge Date:                  Expected Discharge Plan:     In-House Referral:     Discharge planning Services     Post Acute Care Choice:    Choice offered to:     DME Arranged:    DME Agency:     HH Arranged:    [place] Agency:     Status of Service:     Medicare Important Message Given:    Date Medicare IM Given:    Medicare IM give by:    Date Additional Medicare IM Given:    Additional Medicare Important Message give by:     If discussed at [place] of Stay Meetings, dates discussed:    Additional Comments:  Jolly Mango, RN 06/01/2015, 1:16 PM

## 2015-06-01 NOTE — Care Management Note (Deleted)
Case Management Note  Patient Details  Name: Daniel Huff MRN: [id number] Date of Birth: [birth year]  Subjective/Objective:  Patient is not currently enrolled in the [place].                   Action/Plan:   Expected Discharge Date:                  Expected Discharge Plan:     In-House Referral:     Discharge planning Services     Post Acute Care Choice:    Choice offered to:     DME Arranged:    DME Agency:     HH Arranged:    [place] Agency:     Status of Service:     Medicare Important Message Given:    Date Medicare IM Given:    Medicare IM give by:    Date Additional Medicare IM Given:    Additional Medicare Important Message give by:     If discussed at [place] of Stay Meetings, dates discussed:    Additional Comments:  Jolly Mango, RN 06/01/2015, 4:14 PM

## 2015-06-01 NOTE — NC FL2 (Deleted)
[place] LEVEL OF CARE SCREENING TOOL     IDENTIFICATION  Patient Name: Daniel Huff Birthdate: Aug 08, 1926 Sex: male Admission Date (Current Location): 05/31/2015  [place] and [place] Number:  Engineering geologist and Address:  [facility], [address] [place], [place] [address]      Provider Number: [id number]  Attending Physician Name and Address:  Epifanio Lesches, MD  Relative Name and Phone Number:       Current Level of Care: Hospital Recommended Level of Care: [place] [facility]) Prior Approval Number:    Date Approved/Denied:   PASRR Number:  ([id number]  )  Discharge Plan: Domiciliary (Rest home) ([id number] K  )    Current Diagnoses: Patient Active Problem List   Diagnosis Date Noted  . Lung mass 05/31/2015  . Multiple falls 05/31/2015  . Multiple rib fractures 05/31/2015  . Compression fracture of T12 vertebra ([place]) 05/31/2015  . Compression fracture of L4 lumbar vertebra ([place]) 05/31/2015  . HTN (hypertension) 05/31/2015  . Aortic valve replaced 05/31/2015  . CAD (coronary artery disease) 05/31/2015  . BPH (benign prostatic hyperplasia) 05/31/2015    Orientation RESPIRATION BLADDER Height & Weight    Self, Situation, Place  O2 (Nasal Cannula 2 (L/min) ) Incontinent '5\' 7"'$  (170.2 cm) 121 lbs.  BEHAVIORAL SYMPTOMS/MOOD NEUROLOGICAL BOWEL NUTRITION STATUS   (None)  (None ) Incontinent Diet (Heart )  AMBULATORY STATUS COMMUNICATION OF NEEDS Skin   Extensive Assist Verbally Normal                       Personal Care Assistance Level of Assistance  Bathing, Dressing, Feeding Bathing Assistance: Limited assistance Feeding assistance: Independent Dressing Assistance: Limited assistance     Functional Limitations Info  Sight, Hearing, Speech Sight Info: Adequate Hearing Info: Adequate Speech Info: Adequate    SPECIAL CARE FACTORS FREQUENCY  PT (By licensed  PT), OT (By licensed OT)     PT Frequency:  (5) OT Frequency:  (5)            Contractures      Additional Factors Info  Allergies, Code Status Code Status Info:  (Full Code) Allergies Info:  (Aspirin)           Current Medications (06/01/2015):  This is the current hospital active medication list Current Facility-Administered Medications  Medication Dose Route Frequency Provider Last Rate Last Dose  . acetaminophen (TYLENOL) tablet 650 mg  650 mg Oral Q6H PRN Lance Coon, MD       Or  . acetaminophen (TYLENOL) suppository 650 mg  650 mg Rectal Q6H PRN Lance Coon, MD      . finasteride (PROSCAR) tablet 5 mg  5 mg Oral Daily Lance Coon, MD   5 mg at 06/01/15 1007  . HYDROmorphone (DILAUDID) injection 0.5 mg  0.5 mg Intravenous Q4H PRN Lance Coon, MD      . lidocaine (LIDODERM) 5 % 1 patch  1 patch Transdermal Q24H Lance Coon, MD   1 patch at 06/01/15 [phone]  . metoprolol succinate (TOPROL-XL) 24 hr tablet 12.5 mg  12.5 mg Oral Daily Lance Coon, MD   12.5 mg at 06/01/15 1007  . ondansetron (ZOFRAN) tablet 4 mg  4 mg Oral Q6H PRN Lance Coon, MD       Or  . ondansetron [facility]) injection 4 mg  4 mg Intravenous Q6H PRN Lance Coon, MD      . oxyCODONE (Oxy IR/ROXICODONE) immediate  release tablet 5 mg  5 mg Oral Q4H PRN Lance Coon, MD   5 mg at 06/01/15 0316  . sodium chloride flush (NS) 0.9 % injection 3 mL  3 mL Intravenous Q12H Lance Coon, MD   3 mL at 06/01/15 1007  . [START ON 06/02/2015] warfarin (COUMADIN) tablet 1 mg  1 mg Oral Once per day on Fri Lance Coon, MD      . warfarin (COUMADIN) tablet 2 mg  2 mg Oral Once per day on Sun Mon Tue Wed Thu Sat Lance Coon, MD      . Warfarin - Pharmacist Dosing Inpatient   Does not apply q1800 Lance Coon, MD         Discharge Medications: Please see discharge summary for a list of discharge medications.  Relevant Imaging Results:  Relevant Lab Results:   Additional Information  (SSN  [id number])  Lorenso Quarry Kambrie Eddleman, LCSW

## 2015-06-01 NOTE — Progress Notes (Signed)
ANTICOAGULATION CONSULT NOTE - Initial Consult  Pharmacy Consult for warfarin dosing Indication: mechanical aortic valve  Allergies  Allergen Reactions  . Aspirin Other (See Comments)    Reaction:  Unknown     Patient Measurements: Height: '5\' 7"'$  (170.2 cm) Weight: 121 lb 6.4 oz (55.067 kg) IBW/kg (Calculated) : 66.1 Heparin Dosing Weight: n/a  Vital Signs: Temp: 98.2 F (36.8 C) (01/26 0301) Temp Source: Oral (01/26 0301) BP: 165/81 mmHg (01/26 0301) Pulse Rate: 73 (01/26 0301)  Labs:  Recent Labs  05/31/15 1618 06/01/15 0018  HGB 12.2*  --   HCT 37.6*  --   PLT 274  --   APTT 45*  --   LABPROT 27.5*  --   INR 2.60  --   CREATININE 1.41*  --   TROPONINI 0.04* 0.05*    Estimated Creatinine Clearance: 29.3 mL/min (by C-G formula based on Cr of 1.41).   Medical History: Past Medical History  Diagnosis Date  . PVD (peripheral vascular disease) ([place])   . Hypertension   . DJD (degenerative joint disease)   . Ventricular tachycardia (paroxysmal) ([place])   . BPH (benign prostatic hyperplasia)   . Cardiomyopathy ([place])     Medications:    Assessment: Hgb 12.2  INR 2.60  Goal of Therapy:  INR 2-3 Monitor platelets by anticoagulation protocol: Yes   Plan:  Continue home regimen of 1 mg on Friday with 2 mg all other days of the week. F/u INR in AM.  Dailin Sosnowski S 06/01/2015,3:38 AM

## 2015-06-02 DIAGNOSIS — E43 Unspecified severe protein-calorie malnutrition: Secondary | ICD-10-CM | POA: Insufficient documentation

## 2015-06-02 LAB — IRON AND TIBC
Iron: 24 ug/dL — ABNORMAL LOW (ref 45–182)
Saturation Ratios: 11 % — ABNORMAL LOW (ref 17.9–39.5)
TIBC: 215 ug/dL — ABNORMAL LOW (ref 250–450)
UIBC: 191 ug/dL

## 2015-06-02 LAB — PROTIME-INR
INR: 3.02
Prothrombin Time: 30.8 seconds — ABNORMAL HIGH (ref 11.4–15.0)

## 2015-06-02 LAB — FOLATE: Folate: 16.4 ng/mL (ref >5.9)

## 2015-06-02 LAB — FERRITIN: Ferritin: 161 ng/mL (ref 24–336)

## 2015-06-02 LAB — MRSA PCR SCREENING: MRSA BY PCR: NEGATIVE

## 2015-06-02 LAB — VITAMIN B12: Vitamin B-12: 311 pg/mL (ref 180–914)

## 2015-06-02 MED ORDER — FERROUS SULFATE 325 (65 FE) MG PO TABS
325.0000 mg | ORAL_TABLET | Freq: Two times a day (BID) | ORAL | Status: DC
  Administered 2015-06-02 – 2015-06-06 (×9): 325 mg via ORAL
  Filled 2015-06-02 (×9): qty 1

## 2015-06-02 MED ORDER — DOCUSATE SODIUM 100 MG PO CAPS
100.0000 mg | ORAL_CAPSULE | Freq: Two times a day (BID) | ORAL | Status: DC
  Administered 2015-06-02 – 2015-06-06 (×9): 100 mg via ORAL
  Filled 2015-06-02 (×9): qty 1

## 2015-06-02 MED ORDER — WARFARIN SODIUM 1 MG PO TABS
1.0000 mg | ORAL_TABLET | Freq: Every day | ORAL | Status: DC
  Administered 2015-06-02: 1 mg via ORAL
  Filled 2015-06-02: qty 1

## 2015-06-02 MED ORDER — OXYCODONE-ACETAMINOPHEN 5-325 MG PO TABS
1.0000 | ORAL_TABLET | Freq: Four times a day (QID) | ORAL | Status: DC | PRN
  Administered 2015-06-03 – 2015-06-06 (×7): 1 via ORAL
  Filled 2015-06-02 (×7): qty 1

## 2015-06-02 NOTE — Progress Notes (Signed)
Patient alert and oriented x4, no complaints at this time. vss at this time. Patient AV paced on telemetry. Will continue to assess. Daniel Huff

## 2015-06-02 NOTE — Progress Notes (Addendum)
CSW spoke to RN Case Freight forwarder. RN Case Manager informed CSW that [facility] and has a a P[facility]. She reports that patient 30% benefits with [facility] and does not qualify for SNF placement. Per RN Case Manager patient cannot be transferred to [facility] because they are currently on diversion. CSW was contaacted by Glennon Mac [phone] ext [phone]) He reports that he does not work with patient and stated he'd have Rosanne Sack call CSW. CSW is awaiting a phone call form Rosanne Sack. CSW contacted patient's Legal Guardian, [place] [employer] [facility] and left voicemail to determine next steps for patient's discharge plans. CSW spoke to Alberteen Spindle [phone], over [facility]. He inquired about patient being transported to the [place]. CSW informed Mr. Dia Sitter that patient's discharge plans were still pending but he would be informed of patient's discharge plans when they are confirmed. CSW left voicemail for Meriel Pica at the [place] to discuss patient's discharge options. CSW will continue to follow and assist.   Ernest Pine, MSW, [place] Work Department [phone]

## 2015-06-02 NOTE — Progress Notes (Signed)
Occupational Therapy Treatment Patient Details Name: Daniel Huff MRN: [id number] DOB: 04/01/1927 Today's Date: 06/02/2015    History of present illness Daniel Huff is a 82 y.o. male who presents with multiple falls recently. Patient came in today after fall at home. He states that he was putting clothes away when he fell backwards, and tripped over his walker. He states that he had a fall about 2 weeks ago, which was also similar, and involved his walker as well. Today he complains of significant right-sided chest pain. Imaging shows some rib fractures. CT also revealed thoracic and lumbar subacute compression fractures. Patient is experiencing very shallow breathing due to his pain, which is leading to some mild hypoxia as well. Hospitals were called for admission for pain control and monitoring. Of note, his CT scan also showed some lung lesions. Specifically he had a left lower lobe spiculated lung lesion which needs follow-up imaging and evaluation.   OT comments  Patient agreed to Occupational Therapy. He continues to demonstrate deficits in pain, mobility and activities of daily living.  Follow Up Recommendations  SNF    Equipment Recommendations       Recommendations for Other Services      Precautions / Restrictions Precautions Precautions: Fall Restrictions Weight Bearing Restrictions: No       Mobility Bed Mobility                  Transfers                      Balance                                   ADL                                         General ADL Comments: Practiced techniques for lower body dressing using hip kit. Patient practiced techniques for Donned/doffed socks and pants to knees with hand over hand assist to illustrate hip kit use, and physical and verbal cues for technique and safety.      Vision                     Perception     Praxis      Cognition   Behavior  During Therapy: WFL for tasks assessed/performed Overall Cognitive Status: Within Functional Limits for tasks assessed                       Extremity/Trunk Assessment               Exercises     Shoulder Instructions       General Comments      Pertinent Vitals/ Pain          Home Living                                          Prior Functioning/Environment              Frequency       Progress Toward Goals  OT Goals(current goals can now be found in the care plan section)        Plan  Co-evaluation                 End of Session Equipment Utilized During Treatment:  (Hip kit)   Activity Tolerance Patient limited by pain   Patient Left in bed;with call bell/phone within reach;with bed alarm set   Nurse Communication          Time: 0459-9774 OT Time Calculation (min): 12 min  Charges: OT General Charges $OT Visit: 1 Procedure OT Treatments $Self Care/Home Management : 8-22 mins Sharon Mt, MS/OTR/L  Sharon Mt 06/02/2015, 4:43 PM

## 2015-06-02 NOTE — Progress Notes (Signed)
ANTICOAGULATION CONSULT NOTE - Follow Up Consult  Pharmacy Consult for warfarin dosing Indication: mechanical aortic valve  Allergies  Allergen Reactions  . Aspirin Other (See Comments)    Reaction:  Unknown     Patient Measurements: Height: '5\' 7"'$  (170.2 cm) Weight: 121 lb 6.4 oz (55.067 kg) IBW/kg (Calculated) : 66.1 Heparin Dosing Weight: n/a  Vital Signs: Temp: 98.2 F (36.8 C) (01/27 0456) Temp Source: Oral (01/27 0456) BP: 150/78 mmHg (01/27 0456) Pulse Rate: 71 (01/27 0456)  Labs:  Recent Labs  05/31/15 1618 06/01/15 0018 06/01/15 0717 06/01/15 1331 06/02/15 0527  HGB 12.2*  --  10.9*  --   --   HCT 37.6*  --  33.2*  --   --   PLT 274  --  232  --   --   APTT 45*  --   --   --   --   LABPROT 27.5*  --   --   --  30.8*  INR 2.60  --   --   --  3.02  CREATININE 1.41*  --  1.35*  --   --   TROPONINI 0.04* 0.05* 0.04* 0.05*  --     Estimated Creatinine Clearance: 30.6 mL/min (by C-G formula based on Cr of 1.35).   Medical History: Past Medical History  Diagnosis Date  . PVD (peripheral vascular disease) ([place])   . Hypertension   . DJD (degenerative joint disease)   . Ventricular tachycardia (paroxysmal) ([place])   . BPH (benign prostatic hyperplasia)   . Cardiomyopathy ([place])     Medications:  Warfarin 1 mg on Fridays and 2 mg all other days of the week  Assessment: Hgb 10.9  INR 3.02  Goal of Therapy:  INR 2-3 Monitor platelets by anticoagulation protocol: Yes   Plan:  Patient's INR is high end of goal range.  Will transition patient to lower dose of warfarin '1mg'$  po daily (~20% reduction in weekly dose).  Will continue to follow closely for dosing changes.  INR ordered in AM.  Pharmacy will continue to follow.  Aleene Swanner G 06/02/2015,7:43 AM

## 2015-06-02 NOTE — Consult Note (Signed)
ORTHOPAEDIC CONSULTATION  REQUESTING PHYSICIAN: Epifanio Lesches, MD  Chief Complaint: Thoracic back pain  HPI: Daniel Huff is a 82 y.o. male who complains of  thoracic back pain secondary to recent falls.  Patient has been admitted for back and rib pain.  He had CT scans of the head and neck, chest and lumbar region.  He has some recent rib fractures as well as old ones.  In addition, he has signs on CT scan of compression fractures at T3, T4, T5 T12 and L4.  These all appeared to be subacute.  He denies any neurologic signs or symptoms.  Past Medical History  Diagnosis Date  . PVD (peripheral vascular disease) ([place])   . Hypertension   . DJD (degenerative joint disease)   . Ventricular tachycardia (paroxysmal) ([place])   . BPH (benign prostatic hyperplasia)   . Cardiomyopathy [facility])    Past Surgical History  Procedure Laterality Date  . Pacemaker placement    . Cardiac surgery     Social History   Social History  . Marital Status: Married    Spouse Name: N/A  . Number of Children: N/A  . Years of Education: N/A   Social History Main Topics  . Smoking status: Former Smoker    Quit date: 05/06/2012  . Smokeless tobacco: None  . Alcohol Use: None  . Drug Use: None  . Sexual Activity: Not Asked   Other Topics Concern  . None   Social History Narrative   Family History  Problem Relation Age of Onset  . Heart failure Mother   . Heart failure Father    Allergies  Allergen Reactions  . Aspirin Other (See Comments)    Reaction:  Unknown    Prior to Admission medications   Medication Sig Start Date End Date Taking? Authorizing Provider  acetaminophen (TYLENOL) 325 MG tablet Take 975 mg by mouth 3 (three) times daily.    Yes Historical Provider, MD  allopurinol (ZYLOPRIM) 100 MG tablet Take 100 mg by mouth daily.   Yes Historical Provider, MD  Calcium Carbonate-Vitamin D (CALCIUM 600+D) 600-400 MG-UNIT per tablet Take 1 tablet by mouth 2 (two) times daily  with a meal.   Yes Historical Provider, MD  finasteride (PROSCAR) 5 MG tablet Take 5 mg by mouth daily.   Yes Historical Provider, MD  guaiFENesin (ROBITUSSIN) 100 MG/5ML liquid Take 100 mg by mouth 2 (two) times daily as needed for cough.    Yes Historical Provider, MD  magnesium oxide (MAG-OX) 400 MG tablet Take 400 mg by mouth daily.   Yes Historical Provider, MD  Melatonin 3 MG TABS Take 3 mg by mouth at bedtime.   Yes Historical Provider, MD  metoprolol succinate (TOPROL-XL) 25 MG 24 hr tablet Take 12.5 mg by mouth daily.   Yes Historical Provider, MD  oxyCODONE (OXY IR/ROXICODONE) 5 MG immediate release tablet Take 2.5 mg by mouth every 4 (four) hours as needed for severe pain.   Yes Historical Provider, MD  senna-docusate (SENOKOT-S) 8.6-50 MG tablet Take 1 tablet by mouth daily.   Yes Historical Provider, MD  simvastatin (ZOCOR) 10 MG tablet Take 5 mg by mouth at bedtime.   Yes Historical Provider, MD  warfarin (COUMADIN) 1 MG tablet Take 1 mg by mouth at bedtime. Pt only takes on Friday.   Yes Historical Provider, MD  warfarin (COUMADIN) 2 MG tablet Take 2 mg by mouth at bedtime. Pt takes on Sunday, Monday, Tuesday, Wednesday, Thursday, and Saturday.   Yes Historical  Provider, MD   Dg Chest 2 View  05/31/2015  CLINICAL DATA:  Chest pain following a fall today.  Ex-smoker. EXAM: CHEST  2 VIEW COMPARISON:  12/26/2014. FINDINGS: Stable enlarged cardiac silhouette, post CABG changes and left subclavian pacer and AICD leads. Stable mild linear scarring at the left lung base. Clear right lung. Normal vascularity. Diffuse osteopenia. No fracture pneumothorax seen. IMPRESSION: No acute abnormality. Electronically Signed   By: Claudie Revering M.D.   On: 05/31/2015 16:05   Ct Head Wo Contrast  05/31/2015  CLINICAL DATA:  Pain following fall EXAM: CT HEAD WITHOUT CONTRAST CT CERVICAL SPINE WITHOUT CONTRAST TECHNIQUE: Multidetector CT imaging of the head and cervical spine was performed following the  standard protocol without intravenous contrast. Multiplanar CT image reconstructions of the cervical spine were also generated. COMPARISON:  None. FINDINGS: CT HEAD FINDINGS There is moderate diffuse atrophy. There is no intracranial mass, hemorrhage, extra-axial fluid collection, or midline shift. There is moderate small vessel disease in the centra semiovale bilaterally. There are prior small infarcts in the genu of each right internal capsule. Small infarct on the right also extends into the medial posterior right putamen. There is small vessel disease in the pons bilaterally in the basilar perforator distribution. No acute infarct is evident. The bony calvarium appears intact. The mastoid air cells are clear. No intraorbital lesions are appreciable. There is opacification of multiple ethmoid air cells bilaterally as well as the right maxillary antrum. CT CERVICAL SPINE FINDINGS There is levoscoliosis. No fracture is evident. There is 3 mm of anterolisthesis of C4 on C5. There is 2 mm of retrolisthesis of C5 on C6. There is minimal anterolisthesis of C7 on T1. These areas of spondylolisthesis are felt to be due to underlying spondylosis. The prevertebral soft tissues and predental space regions are normal. Bones are osteoporotic. There is multifocal facet arthropathy. Exit foraminal narrowing is noted at multiple levels, most pronounced at C4-5 on the right and C5-6 bilaterally. There is no frank disc extrusion or high-grade stenosis. There are foci of carotid artery calcification bilaterally. There is also calcification in the visualized aortic arch region. There are several healed posterior right rib fractures which do not appear acute. IMPRESSION: CT head: Atrophy with prior small infarcts and small vessel disease. No intracranial mass, hemorrhage, or extra-axial fluid collection. No acute infarct evident. Multifocal paranasal sinus disease is present. CT cervical spine: No demonstrable fracture. Scoliosis  with several areas of mild spondylolisthesis, felt to be due to underlying spondylosis. There is extensive multifocal osteoarthritic change. Facet hypertrophy overall tends to be more severe on the right than on the left. There are foci of carotid artery calcification bilaterally. Several old right posterior rib fractures which appear healed. Electronically Signed   By: Lowella Grip III M.D.   On: 05/31/2015 16:16   Ct Chest W Contrast  05/31/2015  CLINICAL DATA:  Fall EXAM: CT CHEST WITH CONTRAST TECHNIQUE: Multidetector CT imaging of the chest was performed during intravenous contrast administration. CONTRAST:  85m OMNIPAQUE IOHEXOL 300 MG/ML  SOLN COMPARISON:  None. FINDINGS: Normal thyroid No evidence of mediastinal hemorrhage. No abnormal mediastinal adenopathy. Ascending aortic maximal caliber is 4.1 cm. Scattered atherosclerotic changes are noted within the great vessels. No evidence of aortic dissection or intramural hematoma. No evidence of aortic injury. Left subclavian pacemaker device in place.  Status post CABG. No pneumothorax or pleural effusion. Hyperaeration. Scattered linear atelectasis. 1.1 x 2.3 cm spiculated superior segment left lower lobe lung masses worrisome for malignancy.  No pneumothorax or pleural effusion. Subacute right rib fractures on the right are present on images 19, 26, and 32. Acute right rib fractures laterally on image 33, 40, and 43. Chronic additional right rib deformities. Subacute posterior right rib fracture on image number 40. Acute right rib fracture on image 47 posteriorly. There are compression fractures at T12, T5, T3, and T4. There is 50% loss of height at T12. Mild loss of height occurs at T4 and T5. 30% loss of height anteriorly at T3. There is no obvious retropulsion at these fractures. Benign appearing cyst in the liver. Calcified granulomata in the liver. Renal cysts. IMPRESSION: There are acute and subacute right-sided rib fractures Multiple thoracic  compression deformities of indeterminate age. MRI can be performed to further delineate. 2.3 cm mass in the superior segment of the left lower lobe is worrisome for malignancy. PET-CT is recommended to further characterize. Ascending aorta is 4.1 cm in caliber. Recommend annual imaging followup by CTA or MRA. This recommendation follows 2010 ACCF/AHA/AATS/ACR/ASA/SCA/SCAI/SIR/STS/SVM Guidelines for the Diagnosis and Management of Patients with Thoracic Aortic Disease. Circulation. 2010; 121: S063-K160 Electronically Signed   By: Marybelle Killings M.D.   On: 05/31/2015 18:43   Ct Cervical Spine Wo Contrast  05/31/2015  : CT head and CT cervical spine reports are combined into a single dictation. Electronically Signed   By: Lowella Grip III M.D.   On: 05/31/2015 16:16   Ct Lumbar Spine Wo Contrast  05/31/2015  CLINICAL DATA:  Pain status post fall. EXAM: CT LUMBAR SPINE WITHOUT CONTRAST TECHNIQUE: Multidetector CT imaging of the lumbar spine was performed without intravenous contrast administration. Multiplanar CT image reconstructions were also generated. COMPARISON:  None. FINDINGS: There is a 12 mm right lower lobe subpleural mass versus area of round atelectasis. There is fusiform dilation of the abdominal aorta with maximum transverse diameter of 3.2 cm, on the background of advanced calcified atherosclerotic disease. There is residual contrast opacification of bilateral renal collecting systems. 3 mm vascular calcification versus nonobstructing right renal calculus is seen. There is a 1.6 cm left renal cyst. There is diffuse osteopenia. There is superior endplate compression deformity of T12 vertebral body with approximately 50% height loss, without evidence of acute fracture line. There is milder compression deformity of L4 vertebral body with approximately 30% height loss. There is are moderate osteoarthritic changes of the lumbosacral spine, with posterior facet arthropathy, more significant in the  lower lumbosacral spine, at L4-L5 and L5-S1. There is curvilinear sclerotic line through the right sacrum, which may represent a subacute fracture. Posterior right T11 and T12 probably subacute fractures are seen. IMPRESSION: Diffuse osteopenia. Superior endplate compression deformity of T12 vertebral body with approximately 50% height loss, likely subacute. Superior endplate compression deformity of L4 vertebral body with approximate 30% height loss, likely subacute. Curvilinear sclerotic line through the right sacrum may represent an old sacral fracture. Posterior right T11 and T12 probably subacute rib fractures. Moderate osteoarthritic changes of the lumbosacral spine, with posterior facet arthropathy, most significant in the lower lumbosacral spine. Fusiform dilation of the proximal abdominal aorta with maximum diameter of 3.2 cm. Recommend followup by ultrasound in 3 years. This recommendation follows ACR consensus guidelines: White Paper of the ACR Incidental Findings Committee II on Vascular Findings. J Am Coll Radiol 2013; 10:932-355 12 mm right lower lobe subpleural soft tissue thickening which may represent an area of round atelectasis or true pulmonary mass. Attention on future follow-up is recommended. Electronically Signed   By: Fidela Salisbury  M.D.   On: 05/31/2015 20:25    Positive ROS: All other systems have been reviewed and were otherwise negative with the exception of those mentioned in the HPI and as above.  Physical Exam: General: Alert, no acute distress Cardiovascular: No pedal edema Respiratory: No cyanosis, no use of accessory musculature GI: No organomegaly, abdomen is soft and non-tender Skin: No lesions in the area of chief complaint Neurologic: Sensation intact distally Psychiatric: Patient is competent for consent with normal mood and affect Lymphatic: No axillary or cervical lymphadenopathy  MUSCULOSKELETAL: Patient is alert and cooperative.  He has significant  thoracic kyphosis.  He is tender to percussion in the mid thoracic region.  There is no swelling or ecchymosis.  His neurovascular status is good distally with negative straight leg raising.  Shoulders have good motion.  Hips have good motion.  Assessment: Thoracic pain with multiple compression fractures.  No new or acute fracture is readily identifiable.    Plan: I would treat this symptomatically for now.  If significant pain persists in this area an MRI scan can be obtained. If significant new fracture pathology is identified, Dr. Rudene Christians may be called to evaluate him for a kyphoplasty. Heat, physical therapy, and mild pain medications would be appropriate now.   Park Breed, MD [phone]   06/02/2015 2:19 PM

## 2015-06-02 NOTE — Progress Notes (Signed)
[facility] [place] NAME: Daniel Huff    MR#:  [id number]  DATE OF BIRTH:  07-17-1926  SUBJECTIVE: Patient is admitted because of multiple falls, to have rib fractures in thoracic and lumbar vertebra. Also found to have a lung mass.  seen today, she says she feels awful,having rib pain and back pain issues.   CHIEF COMPLAINT:  No chief complaint on file.   REVIEW OF SYSTEMS:   ROS CONSTITUTIONAL: No fever, fatigue or weakness.  EYES: No blurred or double vision.  EARS, NOSE, AND THROAT: No tinnitus or ear pain.  RESPIRATORY:  cough, shortness of breath, nowheezing or hemoptysis.  CARDIOVASCULAR: No chest pain, orthopnea, edema.  GASTROINTESTINAL: No nausea, vomiting, diarrhea or abdominal pain.  GENITOURINARY: No dysuria, hematuria.  ENDOCRINE: No polyuria, nocturia,  HEMATOLOGY: No anemia, easy bruising or bleeding SKIN: No rash or lesion. MUSCULOSKELETAL: Lot of back pain.  NEUROLOGIC: No tingling, numbness, weakness.  PSYCHIATRY: No anxiety or depression.   DRUG ALLERGIES:   Allergies  Allergen Reactions  . Aspirin Other (See Comments)    Reaction:  Unknown     VITALS:  Blood pressure 125/63, pulse 72, temperature 98.2 F (36.8 C), temperature source Oral, resp. rate 22, height '5\' 7"'$  (1.702 m), weight 55.067 kg (121 lb 6.4 oz), SpO2 98 %.  PHYSICAL EXAMINATION:  GENERAL:  82 y.o.-year-old patient lying in the bed with no acute distress.  EYES: Pupils equal, round, reactive to light and accommodation. No scleral icterus. Extraocular muscles intact.  HEENT: Head atraumatic, normocephalic. Oropharynx and nasopharynx clear.  NECK:  Supple, no jugular venous distention. No thyroid enlargement, no tenderness.  LUNGS: Normal breath sounds bilaterally, no wheezing, rales,rhonchi or crepitation. No use of accessory muscles of respiration.  CARDIOVASCULAR: S1, S2 normal. No murmurs, rubs, or gallops.  ABDOMEN: Soft,  nontender, nondistended. Bowel sounds present. No organomegaly or mass.  EXTREMITIES: No pedal edema, cyanosis, or clubbing.  NEUROLOGIC: Cranial nerves II through XII are intact. Muscle strength 5/5 in all extremities. Sensation intact. Gait not checked.  PSYCHIATRIC: The patient is alert and oriented x 3.  SKIN: No obvious rash, lesion, or ulcer.    LABORATORY PANEL:   CBC  Recent Labs Lab 06/01/15 0717  WBC 7.5  HGB 10.9*  HCT 33.2*  PLT 232   ------------------------------------------------------------------------------------------------------------------  Chemistries   Recent Labs Lab 06/01/15 0717  NA 140  K 4.3  CL 107  CO2 31  GLUCOSE 104*  BUN 32*  CREATININE 1.35*  CALCIUM 9.1   ------------------------------------------------------------------------------------------------------------------  Cardiac Enzymes  Recent Labs Lab 06/01/15 1331  TROPONINI 0.05*   ------------------------------------------------------------------------------------------------------------------  RADIOLOGY:  Dg Chest 2 View  05/31/2015  CLINICAL DATA:  Chest pain following a fall today.  Ex-smoker. EXAM: CHEST  2 VIEW COMPARISON:  12/26/2014. FINDINGS: Stable enlarged cardiac silhouette, post CABG changes and left subclavian pacer and AICD leads. Stable mild linear scarring at the left lung base. Clear right lung. Normal vascularity. Diffuse osteopenia. No fracture pneumothorax seen. IMPRESSION: No acute abnormality. Electronically Signed   By: Claudie Revering M.D.   On: 05/31/2015 16:05   Ct Head Wo Contrast  05/31/2015  CLINICAL DATA:  Pain following fall EXAM: CT HEAD WITHOUT CONTRAST CT CERVICAL SPINE WITHOUT CONTRAST TECHNIQUE: Multidetector CT imaging of the head and cervical spine was performed following the standard protocol without intravenous contrast. Multiplanar CT image reconstructions of the cervical spine were also generated. COMPARISON:  None. FINDINGS: CT HEAD  FINDINGS There  is moderate diffuse atrophy. There is no intracranial mass, hemorrhage, extra-axial fluid collection, or midline shift. There is moderate small vessel disease in the centra semiovale bilaterally. There are prior small infarcts in the genu of each right internal capsule. Small infarct on the right also extends into the medial posterior right putamen. There is small vessel disease in the pons bilaterally in the basilar perforator distribution. No acute infarct is evident. The bony calvarium appears intact. The mastoid air cells are clear. No intraorbital lesions are appreciable. There is opacification of multiple ethmoid air cells bilaterally as well as the right maxillary antrum. CT CERVICAL SPINE FINDINGS There is levoscoliosis. No fracture is evident. There is 3 mm of anterolisthesis of C4 on C5. There is 2 mm of retrolisthesis of C5 on C6. There is minimal anterolisthesis of C7 on T1. These areas of spondylolisthesis are felt to be due to underlying spondylosis. The prevertebral soft tissues and predental space regions are normal. Bones are osteoporotic. There is multifocal facet arthropathy. Exit foraminal narrowing is noted at multiple levels, most pronounced at C4-5 on the right and C5-6 bilaterally. There is no frank disc extrusion or high-grade stenosis. There are foci of carotid artery calcification bilaterally. There is also calcification in the visualized aortic arch region. There are several healed posterior right rib fractures which do not appear acute. IMPRESSION: CT head: Atrophy with prior small infarcts and small vessel disease. No intracranial mass, hemorrhage, or extra-axial fluid collection. No acute infarct evident. Multifocal paranasal sinus disease is present. CT cervical spine: No demonstrable fracture. Scoliosis with several areas of mild spondylolisthesis, felt to be due to underlying spondylosis. There is extensive multifocal osteoarthritic change. Facet hypertrophy overall  tends to be more severe on the right than on the left. There are foci of carotid artery calcification bilaterally. Several old right posterior rib fractures which appear healed. Electronically Signed   By: Lowella Grip III M.D.   On: 05/31/2015 16:16   Ct Chest W Contrast  05/31/2015  CLINICAL DATA:  Fall EXAM: CT CHEST WITH CONTRAST TECHNIQUE: Multidetector CT imaging of the chest was performed during intravenous contrast administration. CONTRAST:  22m OMNIPAQUE IOHEXOL 300 MG/ML  SOLN COMPARISON:  None. FINDINGS: Normal thyroid No evidence of mediastinal hemorrhage. No abnormal mediastinal adenopathy. Ascending aortic maximal caliber is 4.1 cm. Scattered atherosclerotic changes are noted within the great vessels. No evidence of aortic dissection or intramural hematoma. No evidence of aortic injury. Left subclavian pacemaker device in place.  Status post CABG. No pneumothorax or pleural effusion. Hyperaeration. Scattered linear atelectasis. 1.1 x 2.3 cm spiculated superior segment left lower lobe lung masses worrisome for malignancy. No pneumothorax or pleural effusion. Subacute right rib fractures on the right are present on images 19, 26, and 32. Acute right rib fractures laterally on image 33, 40, and 43. Chronic additional right rib deformities. Subacute posterior right rib fracture on image number 40. Acute right rib fracture on image 47 posteriorly. There are compression fractures at T12, T5, T3, and T4. There is 50% loss of height at T12. Mild loss of height occurs at T4 and T5. 30% loss of height anteriorly at T3. There is no obvious retropulsion at these fractures. Benign appearing cyst in the liver. Calcified granulomata in the liver. Renal cysts. IMPRESSION: There are acute and subacute right-sided rib fractures Multiple thoracic compression deformities of indeterminate age. MRI can be performed to further delineate. 2.3 cm mass in the superior segment of the left lower lobe is worrisome for  malignancy. PET-CT is recommended to further characterize. Ascending aorta is 4.1 cm in caliber. Recommend annual imaging followup by CTA or MRA. This recommendation follows 2010 ACCF/AHA/AATS/ACR/ASA/SCA/SCAI/SIR/STS/SVM Guidelines for the Diagnosis and Management of Patients with Thoracic Aortic Disease. Circulation. 2010; 121: P950-D326 Electronically Signed   By: Marybelle Killings M.D.   On: 05/31/2015 18:43   Ct Cervical Spine Wo Contrast  05/31/2015  : CT head and CT cervical spine reports are combined into a single dictation. Electronically Signed   By: Lowella Grip III M.D.   On: 05/31/2015 16:16   Ct Lumbar Spine Wo Contrast  05/31/2015  CLINICAL DATA:  Pain status post fall. EXAM: CT LUMBAR SPINE WITHOUT CONTRAST TECHNIQUE: Multidetector CT imaging of the lumbar spine was performed without intravenous contrast administration. Multiplanar CT image reconstructions were also generated. COMPARISON:  None. FINDINGS: There is a 12 mm right lower lobe subpleural mass versus area of round atelectasis. There is fusiform dilation of the abdominal aorta with maximum transverse diameter of 3.2 cm, on the background of advanced calcified atherosclerotic disease. There is residual contrast opacification of bilateral renal collecting systems. 3 mm vascular calcification versus nonobstructing right renal calculus is seen. There is a 1.6 cm left renal cyst. There is diffuse osteopenia. There is superior endplate compression deformity of T12 vertebral body with approximately 50% height loss, without evidence of acute fracture line. There is milder compression deformity of L4 vertebral body with approximately 30% height loss. There is are moderate osteoarthritic changes of the lumbosacral spine, with posterior facet arthropathy, more significant in the lower lumbosacral spine, at L4-L5 and L5-S1. There is curvilinear sclerotic line through the right sacrum, which may represent a subacute fracture. Posterior right T11  and T12 probably subacute fractures are seen. IMPRESSION: Diffuse osteopenia. Superior endplate compression deformity of T12 vertebral body with approximately 50% height loss, likely subacute. Superior endplate compression deformity of L4 vertebral body with approximate 30% height loss, likely subacute. Curvilinear sclerotic line through the right sacrum may represent an old sacral fracture. Posterior right T11 and T12 probably subacute rib fractures. Moderate osteoarthritic changes of the lumbosacral spine, with posterior facet arthropathy, most significant in the lower lumbosacral spine. Fusiform dilation of the proximal abdominal aorta with maximum diameter of 3.2 cm. Recommend followup by ultrasound in 3 years. This recommendation follows ACR consensus guidelines: White Paper of the ACR Incidental Findings Committee II on Vascular Findings. J Am Coll Radiol 2013; 71:245-809 12 mm right lower lobe subpleural soft tissue thickening which may represent an area of round atelectasis or true pulmonary mass. Attention on future follow-up is recommended. Electronically Signed   By: Fidela Salisbury M.D.   On: 05/31/2015 20:25    EKG:   Orders placed or performed during the hospital encounter of 05/31/15  . ED EKG  . ED EKG  . EKG 12-Lead  . EKG 12-Lead    ASSESSMENT AND PLAN:   #1 multiple falls secondary to osteopenia, compression fractures T11-T12, L4. Continue pain medications, obtain orthopedic consult.started percocet,dicotninued IV morphine, #2 left  lung mass: Seen by oncology. Needs outpatient follow-up with PET scan, possible biopsy . #3 hypernatremia: improved.  #4 mildly elevated troponins likely secondary to pleuritic chest pain due to rib fractures. #5 history of PVD 6.H/o Vtach:NSVT mechanical aortic valve placement: On Coumadin. 7.*BPH: Continue Proscar. 8.Possible metastatic compression fractures. Waiting for orthopedic evaluation. 9.iron deficiency anemia;replace  Patient is  from [place], but did not have beds.d/c SNF likely Monday  Can be moved out of tele  All the records are reviewed and case discussed with Care Management/Social Workerr. Management plans discussed with the patient, family and they are in agreement.  CODE STATUS: full TOTAL TIME TAKING CARE OF THIS PATIENT: 2mnutes.   POSSIBLE D/C IN 1-2 DAYS, DEPENDING ON CLINICAL CONDITION.   KEpifanio LeschesM.D on 06/02/2015 at 11:43 AM  Between 7am to 6pm - Pager - [phone]  After 6pm go to [email or web address] - password [email or web address] [facility] EHindsvilleHospitalists  Office  [phone] CC: Primary care physician; No primary care provider on file.   Note: This dictation was prepared with Dragon dictation along with smaller phrase technology. Any transcriptional errors that result from this process are unintentional.

## 2015-06-02 NOTE — Care Management Note (Signed)
Case Management Note  Patient Details  Name: Daniel Huff MRN: [id number] Date of Birth: 10/20/1926  Subjective/Objective:  Patient is active with the [facility]. His PCP  At the [facility] is Dr. Mariea Clonts [phone]). Spoke with Terri Piedra at the [place]. She states patient has only 30% benefits so he does not qualify for SNF benefits under the [place]. He is eligible for acute transfer to the [place] but they are currently on diversion and it is not anticipated that  They will have any beds. Ms. Loma Sousa states that no VA paperwork needs to be completed by this CM at this time regarding this hospital admission. CSW will need to contact Meriel Pica at [phone], Ext. [phone] to discuss needs for placement.                Action/Plan: CSW updated. Will assist as needed.  Expected Discharge Date:                  Expected Discharge Plan:     In-House Referral:  Clinical Social Work  Discharge planning Services  CM Consult  Post Acute Care Choice:    Choice offered to:     DME Arranged:    DME Agency:     HH Arranged:    HH Agency:     Status of Service:  In process, will continue to follow  Medicare Important Message Given:    Date Medicare IM Given:    Medicare IM give by:    Date Additional Medicare IM Given:    Additional Medicare Important Message give by:     If discussed at [place] of Stay Meetings, dates discussed:    Additional Comments:  Jolly Mango, RN 06/02/2015, 10:09 AM

## 2015-06-02 NOTE — Care Management Important Message (Signed)
Important Message  Patient Details  Name: Daniel Huff MRN: [id number] Date of Birth: 27-Apr-1927   Medicare Important Message Given:  Yes    Juliann Pulse A [name] 06/02/2015, 11:20 AM

## 2015-06-02 NOTE — Progress Notes (Signed)
Pt from an assisted facility, no results for MRSA PCR screening on file. Specimen collected and sent to Lab.

## 2015-06-03 LAB — PROTIME-INR
INR: 3.16
PROTHROMBIN TIME: 31.8 s — AB (ref 11.4–15.0)

## 2015-06-03 MED ORDER — WARFARIN SODIUM 1 MG PO TABS: 1.0000 mg | ORAL_TABLET | Freq: Every day | ORAL | Status: DC
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
## 2015-06-03 NOTE — Progress Notes (Signed)
ANTICOAGULATION CONSULT NOTE - Follow Up Consult  Pharmacy Consult for warfarin dosing Indication: mechanical aortic valve  Allergies  Allergen Reactions  . Aspirin Other (See Comments)    Reaction:  Unknown     Patient Measurements: Height: '5\' 7"'$  (170.2 cm) Weight: 121 lb 6.4 oz (55.067 kg) IBW/kg (Calculated) : 66.1 Heparin Dosing Weight: n/a  Vital Signs: Temp: 98 F (36.7 C) (01/28 0349) Temp Source: Oral (01/28 0349) BP: 143/57 mmHg (01/28 0349) Pulse Rate: 76 (01/28 0349)  Labs:  Recent Labs  05/31/15 1618 06/01/15 0018 06/01/15 0717 06/01/15 1331 06/02/15 0527 06/03/15 0501  HGB 12.2*  --  10.9*  --   --   --   HCT 37.6*  --  33.2*  --   --   --   PLT 274  --  232  --   --   --   APTT 45*  --   --   --   --   --   LABPROT 27.5*  --   --   --  30.8* 31.8*  INR 2.60  --   --   --  3.02 3.16  CREATININE 1.41*  --  1.35*  --   --   --   TROPONINI 0.04* 0.05* 0.04* 0.05*  --   --     Estimated Creatinine Clearance: 30.6 mL/min (by C-G formula based on Cr of 1.35).   Medical History: Past Medical History  Diagnosis Date  . PVD (peripheral vascular disease) ([place])   . Hypertension   . DJD (degenerative joint disease)   . Ventricular tachycardia (paroxysmal) ([place])   . BPH (benign prostatic hyperplasia)   . Cardiomyopathy ([place])     Medications:  Warfarin 1 mg on Fridays and 2 mg all other days of the week  Assessment: INR: 3.16  Goal of Therapy:  INR 2-3 Monitor platelets by anticoagulation protocol: Yes   Plan:  INR is supratherapeutic and continues to increase so will hold dose of Coumadin toaday and f/u AM INR.   Pharmacy will continue to follow.  Ulice Dash D 06/03/2015,7:42 AM

## 2015-06-03 NOTE — Plan of Care (Signed)
Problem: Pain Managment: Goal: General experience of comfort will improve Outcome: Not Progressing Patient complains of pain while awake with minimal relief after pain administration.

## 2015-06-04 LAB — BASIC METABOLIC PANEL
ANION GAP: 4 — AB (ref 5–15)
BUN: 33 mg/dL — ABNORMAL HIGH (ref 6–20)
CALCIUM: 8.8 mg/dL — AB (ref 8.9–10.3)
CO2: 29 mmol/L (ref 22–32)
Chloride: 105 mmol/L (ref 101–111)
Creatinine, Ser: 1.48 mg/dL — ABNORMAL HIGH (ref 0.61–1.24)
GFR calc non Af Amer: 41 mL/min — ABNORMAL LOW (ref >60)
GFR, EST AFRICAN AMERICAN: 48 mL/min — AB (ref >60)
GLUCOSE: 101 mg/dL — AB (ref 65–99)
POTASSIUM: 4.5 mmol/L (ref 3.5–5.1)
Sodium: 138 mmol/L (ref 135–145)

## 2015-06-04 LAB — CBC
HEMATOCRIT: 35.6 % — AB (ref 40.0–52.0)
HEMOGLOBIN: 11.9 g/dL — AB (ref 13.0–18.0)
MCH: 32.3 pg (ref 26.0–34.0)
MCHC: 33.4 g/dL (ref 32.0–36.0)
MCV: 96.6 fL (ref 80.0–100.0)
Platelets: 272 10*3/uL (ref 150–440)
RBC: 3.69 MIL/uL — AB (ref 4.40–5.90)
RDW: 12.4 % (ref 11.5–14.5)
WBC: 9.1 10*3/uL (ref 3.8–10.6)

## 2015-06-04 LAB — PROTIME-INR
INR: 2.69
Prothrombin Time: 28.2 seconds — ABNORMAL HIGH (ref 11.4–15.0)

## 2015-06-04 MED ORDER — WARFARIN SODIUM 1 MG PO TABS
2.0000 mg | ORAL_TABLET | Freq: Every day | ORAL | Status: DC
  Administered 2015-06-04 – 2015-06-05 (×2): 2 mg via ORAL
  Filled 2015-06-04 (×2): qty 2

## 2015-06-04 MED ORDER — SENNA 8.6 MG PO TABS: 1.0000 | ORAL_TABLET | Freq: Every day | ORAL | Status: DC | PRN

## 2015-06-04 NOTE — Progress Notes (Signed)
ANTICOAGULATION CONSULT NOTE - Follow Up Consult  Pharmacy Consult for warfarin dosing Indication: mechanical aortic valve  Allergies  Allergen Reactions  . Aspirin Other (See Comments)    Reaction:  Unknown     Patient Measurements: Height: '5\' 7"'$  (170.2 cm) Weight: 121 lb 6.4 oz (55.067 kg) IBW/kg (Calculated) : 66.1  Vital Signs: Temp: 98.3 F (36.8 C) (01/29 0551) Temp Source: Oral (01/29 0551) BP: 138/59 mmHg (01/29 0551) Pulse Rate: 84 (01/29 0551)  Labs:  Recent Labs  06/01/15 1331 06/02/15 0527 06/03/15 0501 06/04/15 0600  HGB  --   --   --  11.9*  HCT  --   --   --  35.6*  PLT  --   --   --  272  LABPROT  --  30.8* 31.8* 28.2*  INR  --  3.02 3.16 2.69  CREATININE  --   --   --  1.48*  TROPONINI 0.05*  --   --   --     Estimated Creatinine Clearance: 27.9 mL/min (by C-G formula based on Cr of 1.48).   Medical History: Past Medical History  Diagnosis Date  . PVD (peripheral vascular disease) ([place])   . Hypertension   . DJD (degenerative joint disease)   . Ventricular tachycardia (paroxysmal) ([place])   . BPH (benign prostatic hyperplasia)   . Cardiomyopathy ([place])     Medications:  Warfarin 1 mg on Fridays and 2 mg all other days of the week.  Assessment: 1/28 INR 3.16 1/29: INR 2.69  Goal of Therapy:  INR 2-3 Monitor platelets by anticoagulation protocol: Yes   Plan:  INR is therapeutic will continue patients '2mg'$  dose today at 1800. INR ordered with AM labs.    Pharmacy will continue to follow.  Nancy Fetter, PharmD Pharmacy Resident  06/04/2015,7:21 AM

## 2015-06-04 NOTE — Progress Notes (Signed)
[place] at [place] NAME: Daniel Huff    MR#:  [id number]  DATE OF BIRTH:  1927-01-07  SUBJECTIVE: Patient is admitted because of multiple falls, to have rib fractures in thoracic and lumbar vertebra. Also found to have a lung mass.  Having a lot of pain issues getting pain medications, Lidoderm patch. Has constipation.   CHIEF COMPLAINT:  No chief complaint on file.   REVIEW OF SYSTEMS:   ROS CONSTITUTIONAL: generalized weakness,back pain, rib pains. EYES: No blurred or double vision.  EARS, NOSE, AND THROAT: No tinnitus or ear pain.  RESPIRATORY:  cough, shortness of breath, nowheezing or hemoptysis.  CARDIOVASCULAR: No chest pain, orthopnea, edema.  GASTROINTESTINAL: No nausea, vomiting, diarrhea or abdominal pain.  GENITOURINARY: No dysuria, hematuria.  ENDOCRINE: No polyuria, nocturia,  HEMATOLOGY: No anemia, easy bruising or bleeding SKIN: No rash or lesion. MUSCULOSKELETAL: Lot of back pain.  NEUROLOGIC: No tingling, numbness, weakness.  PSYCHIATRY: No anxiety or depression.   DRUG ALLERGIES:   Allergies  Allergen Reactions  . Aspirin Other (See Comments)    Reaction:  Unknown     VITALS:  Blood pressure 143/64, pulse 77, temperature 98.3 F (36.8 C), temperature source Oral, resp. rate 16, height '5\' 7"'$  (1.702 m), weight 55.067 kg (121 lb 6.4 oz), SpO2 94 %.  PHYSICAL EXAMINATION:  GENERAL:  82 y.o.-year-old patient lying in the bed with no acute distress.  EYES: Pupils equal, round, reactive to light and accommodation. No scleral icterus. Extraocular muscles intact.  HEENT: Head atraumatic, normocephalic. Oropharynx and nasopharynx clear.  NECK:  Supple, no jugular venous distention. No thyroid enlargement, no tenderness.  LUNGS: Normal breath sounds bilaterally, but decreased . no wheezing, rales,rhonchi or crepitation. No use of accessory muscles of respiration.  CARDIOVASCULAR: S1, S2 normal. No murmurs,  rubs, or gallops.  ABDOMEN: Soft, nontender, nondistended. Bowel sounds present. No organomegaly or mass.  EXTREMITIES: No pedal edema, cyanosis, or clubbing.  NEUROLOGIC: Cranial nerves II through XII are intact. Muscle strength 5/5 in all extremities. Sensation intact. Gait not checked.  PSYCHIATRIC: The patient is alert and oriented x 3.  SKIN: No obvious rash, lesion, or ulcer.    LABORATORY PANEL:   CBC  Recent Labs Lab 06/04/15 0600  WBC 9.1  HGB 11.9*  HCT 35.6*  PLT 272   ------------------------------------------------------------------------------------------------------------------  Chemistries   Recent Labs Lab 06/04/15 0600  NA 138  K 4.5  CL 105  CO2 29  GLUCOSE 101*  BUN 33*  CREATININE 1.48*  CALCIUM 8.8*   ------------------------------------------------------------------------------------------------------------------  Cardiac Enzymes  Recent Labs Lab 06/01/15 1331  TROPONINI 0.05*   ------------------------------------------------------------------------------------------------------------------  RADIOLOGY:  No results found.  EKG:   Orders placed or performed during the hospital encounter of 05/31/15  . ED EKG  . ED EKG  . EKG 12-Lead  . EKG 12-Lead    ASSESSMENT AND PLAN:   #1 multiple falls secondary to osteopenia, compression fractures T11-T12, L4. Continue pain medications, seen by orthopedic. Conservative management with pain control, heating pad at this time. Physical therapy recommending skilled nursing facility placement. , #2 left  lung mass: Seen by oncology. Needs outpatient follow-up with PET scan, possible biopsy. Discussed this with patient's daughter. Patient is a ward of state, had some issues with pts son  Taking out All the Money from pt. 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  . #3 hypernatremia: improved.,  #4 mildly elevated troponins likely secondary to pleuritic chest pain due to rib fractures.  #5 history of PVD 6.H/o Vtach:NSVT mechanical aortic valve placement: On Coumadin  7.*BPH: Continue Proscar . 8.Possible metastatic compression fractures.  9.iron deficiency anemia;replace  Patient is from [place], but did not have beds.d/c SNF likely Monday  Can be moved out of tele  Discussed patient's daughter. The patient's mother is still living  with the daughter.  All the records are reviewed and case discussed with Care Management/Social Workerr. Management plans discussed with the patient, family and they are in agreement.  CODE STATUS: full TOTAL TIME TAKING CARE OF THIS PATIENT: 106mnutes.   POSSIBLE D/C IN 1-2 DAYS, DEPENDING ON CLINICAL CONDITION.   KEpifanio LeschesM.D on 06/04/2015 at 12:40 PM  Between 7am to 6pm - Pager - [phone]  After 6pm go to [email or web address] - password [email or web address] [facility]  Office  [phone] CC: Primary care physician; No primary care provider on file.   Note: This dictation was prepared with Dragon dictation along with smaller phrase technology. Any transcriptional errors that result from this process are unintentional.

## 2015-06-04 NOTE — Progress Notes (Signed)
V paced. Room air. Takes meds ok. Pt reported pain and received meds. Incontinent. A & O. Pt has no further concerns at this time.

## 2015-06-05 LAB — PROTIME-INR
INR: 2.69
Prothrombin Time: 28.2 seconds — ABNORMAL HIGH (ref 11.4–15.0)

## 2015-06-05 MED ORDER — SODIUM CHLORIDE 0.9 % IV SOLN
INTRAVENOUS | Status: AC
  Administered 2015-06-05 – 2015-06-06 (×2): via INTRAVENOUS

## 2015-06-05 NOTE — Progress Notes (Signed)
[place] at [place] NAME: Daniel Huff    MR#:  [id number]  DATE OF BIRTH:  1926-10-03  SUBJECTIVE: Patient is admitted because of multiple falls, to have rib fractures in thoracic and lumbar vertebra. Also found to have a lung mass.  Having a lot of pain issues getting pain medications, Lidoderm patch. Has constipation.   CHIEF COMPLAINT:  No chief complaint on file.  back pain.  REVIEW OF SYSTEMS:   ROS CONSTITUTIONAL: generalized weakness,back pain, rib pains. EYES: No blurred or double vision.  EARS, NOSE, AND THROAT: No tinnitus or ear pain.  RESPIRATORY:  cough, shortness of breath, nowheezing or hemoptysis.  CARDIOVASCULAR: No chest pain, orthopnea, edema.  GASTROINTESTINAL: No nausea, vomiting, diarrhea or abdominal pain.  GENITOURINARY: No dysuria, hematuria.  ENDOCRINE: No polyuria, nocturia,  HEMATOLOGY: No anemia, easy bruising or bleeding SKIN: No rash or lesion. MUSCULOSKELETAL: Lot of back pain.  NEUROLOGIC: No tingling, numbness, weakness.  PSYCHIATRY: No anxiety or depression.   DRUG ALLERGIES:   Allergies  Allergen Reactions  . Aspirin Other (See Comments)    Reaction:  Unknown     VITALS:  Blood pressure 138/67, pulse 76, temperature 97.8 F (36.6 C), temperature source Oral, resp. rate 18, height '5\' 7"'$  (1.702 m), weight 55.067 kg (121 lb 6.4 oz), SpO2 91 %.  PHYSICAL EXAMINATION:  GENERAL:  82 y.o.-year-old patient lying in the bed with no acute distress.  EYES: Pupils equal, round, reactive to light and accommodation. No scleral icterus. Extraocular muscles intact.  HEENT: Head atraumatic, normocephalic. Oropharynx and nasopharynx clear.  NECK:  Supple, no jugular venous distention. No thyroid enlargement, no tenderness.  LUNGS: Normal breath sounds bilaterally, but decreased . no wheezing, rales,rhonchi or crepitation. No use of accessory muscles of respiration.  CARDIOVASCULAR: S1, S2 normal. No  murmurs, rubs, or gallops.  ABDOMEN: Soft, nontender, nondistended. Bowel sounds present. No organomegaly or mass.  EXTREMITIES: No pedal edema, cyanosis, or clubbing.  NEUROLOGIC: Cranial nerves II through XII are intact. Muscle strength 4/5 in all extremities. Sensation intact. Gait not checked.  PSYCHIATRIC: The patient is alert and oriented x 3.  SKIN: No obvious rash, lesion, or ulcer.    LABORATORY PANEL:   CBC  Recent Labs Lab 06/04/15 0600  WBC 9.1  HGB 11.9*  HCT 35.6*  PLT 272   ------------------------------------------------------------------------------------------------------------------  Chemistries   Recent Labs Lab 06/04/15 0600  NA 138  K 4.5  CL 105  CO2 29  GLUCOSE 101*  BUN 33*  CREATININE 1.48*  CALCIUM 8.8*   ------------------------------------------------------------------------------------------------------------------  Cardiac Enzymes  Recent Labs Lab 06/01/15 1331  TROPONINI 0.05*   ------------------------------------------------------------------------------------------------------------------  RADIOLOGY:  No results found.  EKG:   Orders placed or performed during the hospital encounter of 05/31/15  . ED EKG  . ED EKG  . EKG 12-Lead  . EKG 12-Lead    ASSESSMENT AND PLAN:   #1 multiple falls secondary to osteopenia, compression fractures T11-T12, L4. Continue pain medications, seen by orthopedic. Conservative management with pain control, heating pad at this time. Physical therapy recommending skilled nursing facility placement. , #2 left  lung mass: Seen by oncology. Needs outpatient follow-up with PET scan, possible biopsy. Discussed this with patient's daughter. Patient is a ward of state, had some issues with pts son  Taking out All the Money from pt. 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 Waiting for  palliative care consult.  . #3 hypernatremia: improved.,  #4 mildly elevated troponins likely secondary to pleuritic chest pain due to rib fractures.  #5 history of PVD 6.H/o Vtach:NSVT mechanical aortic valve placement: On Coumadin, INR is subtherapeutic.  7.*BPH: Continue Proscar . 8.Possible metastatic compression fractures.  9.iron deficiency anemia;replace  Patient is from [place], but did not have beds.   * Acute renal failure due to dehydration, start normal saline IV and follow-up BMP.   Discussed patient's daughter and wife.  All the records are reviewed and case discussed with Care Management/Social Workerr. Management plans discussed with the patient, family and they are in agreement.  CODE STATUS: full TOTAL TIME TAKING CARE OF THIS PATIENT: 5mnutes.   POSSIBLE D/C IN 1-2 DAYS, DEPENDING ON CLINICAL CONDITION. Greater than 50% time was spent on coordination of care and face-to-face counseling.  CDemetrios LollM.D on 06/05/2015 at 4:25 PM  Between 7am to 6pm - Pager - [phone]  After 6pm go to [email or web address] - password [email or web address]  Office  [phone] CC: Primary care physician; No primary care provider on file.   Note: This dictation was prepared with Dragon dictation along with smaller phrase technology. Any transcriptional errors that result from this process are unintentional.

## 2015-06-05 NOTE — Progress Notes (Signed)
Patient has been alert and oriented x4 for majority of the day.  All of the sudden is acting more confused, combative.  Does not know where he is, threatening to call the cops on nursing staff.  Dr. Bridgett Larsson was paged with this information.  He states he believes it is delirium and did not give any additional orders at this time.

## 2015-06-05 NOTE — Progress Notes (Signed)
PT Cancellation Note  Patient Details Name: Daniel Huff MRN: [id number] DOB: June 28, 1926   Cancelled Treatment:    Reason Eval/Treat Not Completed: Patient declined, no reason specified. PT attempted mobility session with patient, however he reports he is not interested in attempts at sitting, standing, ambulation, or bed level exercises. PT will hold for now and re-attempt as patient is available and appropriate.   Kerman Passey, PT, DPT    06/05/2015, 2:44 PM

## 2015-06-05 NOTE — Progress Notes (Signed)
CSW presented bed offers to patient's Legal Guardian [name] Garr. Accepted bed offer at [facility]. CSW will continue to follow and assist.   Ernest Pine, MSW, [place] Work Department [phone]

## 2015-06-05 NOTE — Care Management Important Message (Signed)
Important Message  Patient Details  Name: Daniel Huff MRN: [id number] Date of Birth: [birth year]   Medicare Important Message Given:  Yes    Juliann Pulse A Dajanee Voorheis 06/05/2015, 10:09 AM

## 2015-06-05 NOTE — Progress Notes (Addendum)
CSW spoke to patient's Legal Guardian, Eduard Roux over the telephone. CSW explained to [name] that patient did not qualify for VA STR benefits due to his 30% service connection. [name] granted CSW verbal permission to refer patient to SNFs in [facility]. Preference [facility]. Reports that she does not want patient going to [facility]. Requested more information about lung mass. CSW informed [name] that she'd let patient's RN or MD know.   CSW faxed patient's referal via [place]. Awaiting bed offers. CSW will continue to follow and assist.   Ernest Pine, MSW, [place] Work Department [phone]

## 2015-06-05 NOTE — Progress Notes (Signed)
ANTICOAGULATION CONSULT NOTE - Follow Up Consult  Pharmacy Consult for warfarin dosing Indication: mechanical aortic valve  Allergies  Allergen Reactions  . Aspirin Other (See Comments)    Reaction:  Unknown     Patient Measurements: Height: '5\' 7"'$  (170.2 cm) Weight: 121 lb 6.4 oz (55.067 kg) IBW/kg (Calculated) : 66.1  Vital Signs: Temp: 97.8 F (36.6 C) (01/30 1122) Temp Source: Oral (01/30 1122) BP: 138/67 mmHg (01/30 1122) Pulse Rate: 76 (01/30 1122)  Labs:  Recent Labs  06/03/15 0501 06/04/15 0600 06/05/15 0446  HGB  --  11.9*  --   HCT  --  35.6*  --   PLT  --  272  --   LABPROT 31.8* 28.2* 28.2*  INR 3.16 2.69 2.69  CREATININE  --  1.48*  --     Estimated Creatinine Clearance: 27.9 mL/min (by C-G formula based on Cr of 1.48).   Medical History: Past Medical History  Diagnosis Date  . PVD (peripheral vascular disease) ([place])   . Hypertension   . DJD (degenerative joint disease)   . Ventricular tachycardia (paroxysmal) ([place])   . BPH (benign prostatic hyperplasia)   . Cardiomyopathy ([place])     Medications:  Warfarin 1 mg on Fridays and 2 mg all other days of the week.  Assessment: 1/28 INR 3.16 1/29: INR 2.69, Coumadin '2mg'$  1/30: INR 2.69  Goal of Therapy:  INR 2-3 Monitor platelets by anticoagulation protocol: Yes   Plan:  INR is therapeutic will continue patients '2mg'$  dose today at 1800. INR ordered with AM labs.    Pharmacy will continue to follow.  Paulina Fusi, PharmD, BCPS 06/05/2015 4:39 PM

## 2015-06-06 LAB — PROTIME-INR
INR: 3.08
PROTHROMBIN TIME: 31.2 s — AB (ref 11.4–15.0)

## 2015-06-06 LAB — MULTIPLE MYELOMA PANEL, SERUM
Albumin SerPl Elph-Mcnc: 3.1 g/dL (ref 2.9–4.4)
Albumin/Glob SerPl: 1.2 (ref 0.7–1.7)
Alpha 1: 0.3 g/dL (ref 0.0–0.4)
Alpha2 Glob SerPl Elph-Mcnc: 0.6 g/dL (ref 0.4–1.0)
B-Globulin SerPl Elph-Mcnc: 0.9 g/dL (ref 0.7–1.3)
Gamma Glob SerPl Elph-Mcnc: 1 g/dL (ref 0.4–1.8)
Globulin, Total: 2.7 g/dL (ref 2.2–3.9)
IgA: 176 mg/dL (ref 61–437)
IgG (Immunoglobin G), Serum: 893 mg/dL (ref 700–1600)
IgM, Serum: 85 mg/dL (ref 15–143)
Total Protein ELP: 5.8 g/dL — ABNORMAL LOW (ref 6.0–8.5)

## 2015-06-06 LAB — BASIC METABOLIC PANEL
ANION GAP: 6 (ref 5–15)
BUN: 27 mg/dL — ABNORMAL HIGH (ref 6–20)
CALCIUM: 8.5 mg/dL — AB (ref 8.9–10.3)
CO2: 28 mmol/L (ref 22–32)
CREATININE: 1.24 mg/dL (ref 0.61–1.24)
Chloride: 102 mmol/L (ref 101–111)
GFR calc Af Amer: 59 mL/min — ABNORMAL LOW (ref >60)
GFR, EST NON AFRICAN AMERICAN: 51 mL/min — AB (ref >60)
GLUCOSE: 137 mg/dL — AB (ref 65–99)
Potassium: 4.2 mmol/L (ref 3.5–5.1)
Sodium: 136 mmol/L (ref 135–145)

## 2015-06-06 MED ORDER — ACETAMINOPHEN 325 MG PO TABS: 650.0000 mg | ORAL_TABLET | Freq: Four times a day (QID) | ORAL | Status: DC | PRN

## 2015-06-06 MED ORDER — OXYCODONE HCL 5 MG PO TABS: 2.5000 mg | ORAL_TABLET | Freq: Four times a day (QID) | ORAL | Status: DC | PRN

## 2015-06-06 NOTE — Progress Notes (Addendum)
Clinical Social Worker was informed that patient will be medically ready to discharge to [facility]. Patient, Legal Daniel Huff and daughter, Daniel Huff are in agreement with plan. CSW called [facility] admissions coordinator to confirm that patient's bed is ready. Provided patient's room number 510 and number to call for report [phone] . All discharge information faxed to [facility] via [facility]. Rx's added to discharge packet.   Call to patient's Legal Guardian [name], left message to inform her patient would discharge to [facility]. RN will call report and patient will discharge to [facility] via [facility].   3:10 PM CSW spoke to patient's Legal Guardian. She requested discharge summary be faxed to her. CSW manually faxed patient's discharge summary to [facility].   Ernest Pine, MSW, [place] Social Work Department [phone]

## 2015-06-06 NOTE — Discharge Summary (Signed)
[place] at [place] NAME: Daniel Huff    MR#:  [id number]  DATE OF BIRTH:  1926/06/15  DATE OF ADMISSION:  05/31/2015 ADMITTING PHYSICIAN: Lance Coon, MD  DATE OF DISCHARGE: 06/06/2015 PRIMARY CARE PHYSICIAN: No primary care provider on file.    ADMISSION DIAGNOSIS:  Fall, initial encounter [W19.XXXA] Compression fracture of thoracic vertebra, closed, initial encounter ([place]) [S22.000A] Rib fractures, right, closed, initial encounter [S22.31XA]   DISCHARGE DIAGNOSIS:  multiple falls secondary to osteopenia, compression fractures T11-T12, L4.   left lung mass ARF  hypernatremia   SECONDARY DIAGNOSIS:   Past Medical History  Diagnosis Date  . PVD (peripheral vascular disease) ([place])   . Hypertension   . DJD (degenerative joint disease)   . Ventricular tachycardia (paroxysmal) ([place])   . BPH (benign prostatic hyperplasia)   . Cardiomyopathy [facility])     HOSPITAL COURSE:    #1 multiple falls secondary to osteopenia, compression fractures T11-T12, L4. Treated with pain medications, seen by orthopedic. Conservative management with pain control, heating pad at this time. Physical therapy recommending skilled nursing facility placement. , #2 left lung mass: Seen by oncology. Needs outpatient follow-up with PET scan, possible biopsy. Discussed this with patient's daughter. Patient is a ward of state, had some issues with pts son Taking out All the Money from pt. 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 Palliative care consult  From Dr. Megan Salon.  . #3 hypernatremia: improved.,  #4 mildly elevated troponins likely secondary to pleuritic chest pain due to rib fractures.  #5 history of PVD 6.H/o Vtach:NSVT mechanical aortic valve placement: On Coumadin, INR is therapeutic.  7.*BPH: Continue Proscar . 8.Possible  metastatic compression fractures.  9.iron deficiency anemia;replaced  10  Acute renal failure due to dehydration, improving with normal saline IV and follow-up BMP as outpatient.  DISCHARGE CONDITIONS:   Stable, discharge to SNF today.  CONSULTS OBTAINED:  Treatment Team:  Earnestine Leys, MD  DRUG ALLERGIES:   Allergies  Allergen Reactions  . Aspirin Other (See Comments)    Reaction:  Unknown     DISCHARGE MEDICATIONS:   Current Discharge Medication List    CONTINUE these medications which have CHANGED   Details  acetaminophen (TYLENOL) 325 MG tablet Take 2 tablets (650 mg total) by mouth every 6 (six) hours as needed. Qty: 30 tablet, Refills: 2    oxyCODONE (OXY IR/ROXICODONE) 5 MG immediate release tablet Take 0.5 tablets (2.5 mg total) by mouth every 6 (six) hours as needed for moderate pain or severe pain. Qty: 20 tablet, Refills: 0      CONTINUE these medications which have NOT CHANGED   Details  allopurinol (ZYLOPRIM) 100 MG tablet Take 100 mg by mouth daily.    Calcium Carbonate-Vitamin D (CALCIUM 600+D) 600-400 MG-UNIT per tablet Take 1 tablet by mouth 2 (two) times daily with a meal.    finasteride (PROSCAR) 5 MG tablet Take 5 mg by mouth daily.    guaiFENesin (ROBITUSSIN) 100 MG/5ML liquid Take 100 mg by mouth 2 (two) times daily as needed for cough.     magnesium oxide (MAG-OX) 400 MG tablet Take 400 mg by mouth daily.    Melatonin 3 MG TABS Take 3 mg by mouth at bedtime.    metoprolol succinate (TOPROL-XL) 25 MG 24 hr tablet Take 12.5 mg by mouth daily.  senna-docusate (SENOKOT-S) 8.6-50 MG tablet Take 1 tablet by mouth daily.    simvastatin (ZOCOR) 10 MG tablet Take 5 mg by mouth at bedtime.    !! warfarin (COUMADIN) 1 MG tablet Take 1 mg by mouth at bedtime. Pt only takes on Friday.    !! warfarin (COUMADIN) 2 MG tablet Take 2 mg by mouth at bedtime. Pt takes on Sunday, Monday, Tuesday, Wednesday, Thursday, and Saturday.     !! - Potential  duplicate medications found. Please discuss with provider.       DISCHARGE INSTRUCTIONS:   If you experience worsening of your admission symptoms, develop shortness of breath, life threatening emergency, suicidal or homicidal thoughts you must seek medical attention immediately by calling 911 or calling your MD immediately  if symptoms less severe.  You Must read complete instructions/literature along with all the possible adverse reactions/side effects for all the Medicines you take and that have been prescribed to you. Take any new Medicines after you have completely understood and accept all the possible adverse reactions/side effects.   Please note  You were cared for by a hospitalist during your hospital stay. If you have any questions about your discharge medications or the care you received while you were in the hospital after you are discharged, you can call the unit and asked to speak with the hospitalist on call if the hospitalist that took care of you is not available. Once you are discharged, your primary care physician will handle any further medical issues. Please note that NO REFILLS for any discharge medications will be authorized once you are discharged, as it is imperative that you return to your primary care physician (or establish a relationship with a primary care physician if you do not have one) for your aftercare needs so that they can reassess your need for medications and monitor your lab values.    Today   SUBJECTIVE   No complaint.   VITAL SIGNS:  Blood pressure 161/61, pulse 77, temperature 98.5 F (36.9 C), temperature source Oral, resp. rate 17, height '5\' 7"'$  (1.702 m), weight 55.067 kg (121 lb 6.4 oz), SpO2 91 %.  I/O:   Intake/Output Summary (Last 24 hours) at 06/06/15 1155 Last data filed at 06/06/15 0944  Gross per 24 hour  Intake 1433.75 ml  Output      0 ml  Net 1433.75 ml    PHYSICAL EXAMINATION:  GENERAL:  82 y.o.-year-old patient lying in  the bed with no acute distress.  EYES: Pupils equal, round, reactive to light and accommodation. No scleral icterus. Extraocular muscles intact.  HEENT: Head atraumatic, normocephalic. Oropharynx and nasopharynx clear. Dry oral mucosa. NECK:  Supple, no jugular venous distention. No thyroid enlargement, no tenderness.  LUNGS: Normal breath sounds bilaterally, no wheezing, rales,rhonchi or crepitation. No use of accessory muscles of respiration.  CARDIOVASCULAR: S1, S2 normal. No murmurs, rubs, or gallops.  ABDOMEN: Soft, non-tender, non-distended. Bowel sounds present. No organomegaly or mass.  EXTREMITIES: No pedal edema, cyanosis, or clubbing.  NEUROLOGIC: Cranial nerves II through XII are intact. Muscle strength 4/5 in all extremities. Sensation intact. Gait not checked.  PSYCHIATRIC: The patient is alert and oriented x 3.  SKIN: No obvious rash, lesion, or ulcer.   DATA REVIEW:   CBC  Recent Labs Lab 06/04/15 0600  WBC 9.1  HGB 11.9*  HCT 35.6*  PLT 272    Chemistries   Recent Labs Lab 06/06/15 0508  NA 136  K 4.2  CL 102  CO2  28  GLUCOSE 137*  BUN 27*  CREATININE 1.24  CALCIUM 8.5*    Cardiac Enzymes  Recent Labs Lab 06/01/15 1331  TROPONINI 0.05*    Microbiology Results  Results for orders placed or performed during the hospital encounter of 05/31/15  MRSA PCR Screening     Status: None   Collection Time: 06/02/15  4:00 AM  Result Value Ref Range Status   MRSA by PCR NEGATIVE NEGATIVE Final    Comment:        The GeneXpert MRSA Assay (FDA approved for NASAL specimens only), is one component of a comprehensive MRSA colonization surveillance program. It is not intended to diagnose MRSA infection nor to guide or monitor treatment for MRSA infections.     RADIOLOGY:  No results found.      Management plans discussed with the patient, Dr. Megan Salon, social worker and they are in agreement.  CODE STATUS:     Code Status Orders         Start     Ordered   06/01/15 0255  Full code   Continuous     06/01/15 0254    Code Status History    Date Active Date Inactive Code Status Order ID Comments User Context   This patient has a current code status but no historical code status.      TOTAL TIME TAKING CARE OF THIS PATIENT: 38 minutes.    Demetrios Loll M.D on 06/06/2015 at 11:55 AM  Between 7am to 6pm - Pager - [phone]  After 6pm go to [email or web address] - password [email or web address] [facility]  [place] [facility]  Office  [phone]  CC: Primary care physician; No primary care provider on file.

## 2015-06-06 NOTE — NC FL2 (Signed)
[place] LEVEL OF CARE SCREENING TOOL     IDENTIFICATION  Patient Name: Daniel Huff Birthdate: 1926-06-03 Sex: male Admission Date (Current Location): 05/31/2015  [place] and [place] Number:  Engineering geologist and Address:  [facility], [address] [place], [place] [address]      Provider Number: [id number]  Attending Physician Name and Address:  Demetrios Loll, MD  Relative Name and Phone Number:       Current Level of Care: Hospital Recommended Level of Care: [place] Prior Approval Number:    Date Approved/Denied:   PASRR Number:  ([id number]  )  Discharge Plan: [place]  [id number]    Current Diagnoses: Patient Active Problem List   Diagnosis Date Noted  . Protein-calorie malnutrition, severe 06/02/2015  . Lung mass 05/31/2015  . Multiple falls 05/31/2015  . Multiple rib fractures 05/31/2015  . Compression fracture of T12 vertebra ([place]) 05/31/2015  . Compression fracture of L4 lumbar vertebra ([place]) 05/31/2015  . HTN (hypertension) 05/31/2015  . Aortic valve replaced 05/31/2015  . CAD (coronary artery disease) 05/31/2015  . BPH (benign prostatic hyperplasia) 05/31/2015    Orientation RESPIRATION BLADDER Height & Weight     Self, Situation, Place  O2 (Nasal Cannula 2 (L/min) ) Incontinent Weight: 121 lb 6.4 oz (55.067 kg) Height:  '5\' 7"'$  (170.2 cm)  BEHAVIORAL SYMPTOMS/MOOD NEUROLOGICAL BOWEL NUTRITION STATUS   (None)  (None ) Incontinent Diet (Heart )  AMBULATORY STATUS COMMUNICATION OF NEEDS Skin   Extensive Assist Verbally Normal                       Personal Care Assistance Level of Assistance  Bathing, Dressing, Feeding Bathing Assistance: Limited assistance Feeding assistance: Independent Dressing Assistance: Limited assistance     Functional Limitations Info  Sight, Hearing, Speech Sight Info: Adequate Hearing Info: Adequate Speech Info: Adequate     SPECIAL CARE FACTORS FREQUENCY  PT (By licensed PT), OT (By licensed OT)     PT Frequency:  (5) OT Frequency:  (5)            Contractures      Additional Factors Info  Allergies, Code Status Code Status Info:  (Full Code) Allergies Info:  (Aspirin)           Current Medications (06/06/2015):  This is the current hospital active medication list Current Facility-Administered Medications  Medication Dose Route Frequency Provider Last Rate Last Dose  . acetaminophen (TYLENOL) tablet 650 mg  650 mg Oral Q6H PRN Lance Coon, MD       Or  . acetaminophen (TYLENOL) suppository 650 mg  650 mg Rectal Q6H PRN Lance Coon, MD      . docusate sodium (COLACE) capsule 100 mg  100 mg Oral BID Epifanio Lesches, MD   100 mg at 06/06/15 1011  . ferrous sulfate tablet 325 mg  325 mg Oral BID WC Epifanio Lesches, MD   325 mg at 06/06/15 1011  . finasteride (PROSCAR) tablet 5 mg  5 mg Oral Daily Lance Coon, MD   5 mg at 06/06/15 1010  . lidocaine (LIDODERM) 5 % 1 patch  1 patch Transdermal Q24H Lance Coon, MD   1 patch at 06/06/15 0300  . metoprolol succinate (TOPROL-XL) 24 hr tablet 12.5 mg  12.5 mg Oral Daily Lance Coon, MD   12.5 mg at 06/06/15 1011  . ondansetron (ZOFRAN) tablet 4 mg  4 mg Oral Q6H PRN Shanon Brow  Jannifer Franklin, MD       Or  . ondansetron [facility]) injection 4 mg  4 mg Intravenous Q6H PRN Lance Coon, MD      . oxyCODONE (Oxy IR/ROXICODONE) immediate release tablet 5 mg  5 mg Oral Q4H PRN Lance Coon, MD   5 mg at 06/06/15 0405  . oxyCODONE-acetaminophen (PERCOCET/ROXICET) 5-325 MG per tablet 1 tablet  1 tablet Oral Q6H PRN Epifanio Lesches, MD   1 tablet at 06/06/15 1013  . senna (SENOKOT) tablet 8.6 mg  1 tablet Oral Daily PRN Epifanio Lesches, MD      . sodium chloride flush (NS) 0.9 % injection 3 mL  3 mL Intravenous Q12H Lance Coon, MD   3 mL at 06/06/15 1011  . warfarin (COUMADIN) tablet 2 mg  2 mg Oral q1800 Sheema M Hallaji, RPH   2 mg at 06/05/15 1704   . Warfarin - Pharmacist Dosing Inpatient   Does not apply q1800 Lance Coon, MD         Discharge Medications: Please see discharge summary for a list of discharge medications.  Relevant Imaging Results:  Relevant Lab Results:   Additional Information  (SSN [id number])  Lorenso [place], LCSW

## 2015-06-06 NOTE — Progress Notes (Signed)
Report Called to [place] [facility]. EMS called for transport. Patient stable at this time. Will continue to monitor.

## 2015-06-06 NOTE — Discharge Instructions (Signed)
Heart healthy diet. Activity as tolerated. Fall and aspiration precaution. Palliative care follow in [facility].

## 2015-06-06 NOTE — Progress Notes (Signed)
Patient discharged via EMS and stretcher. IV removed and catheter intact. All discharge instructions given and patient verbalizes understanding. Tele removed and returned. Prescriptions in [facility]. No distress noted at time of transfer

## 2015-06-06 NOTE — Progress Notes (Signed)
ANTICOAGULATION CONSULT NOTE - Follow Up Consult  Pharmacy Consult for warfarin dosing Indication: mechanical aortic valve  Allergies  Allergen Reactions  . Aspirin Other (See Comments)    Reaction:  Unknown     Patient Measurements: Height: '5\' 7"'$  (170.2 cm) Weight: 121 lb 6.4 oz (55.067 kg) IBW/kg (Calculated) : 66.1  Vital Signs: Temp: 98.1 F (36.7 C) (01/31 1207) Temp Source: Oral (01/31 1207) BP: 135/63 mmHg (01/31 1207) Pulse Rate: 72 (01/31 1207)  Labs:  Recent Labs  06/04/15 0600 06/05/15 0446 06/06/15 0508  HGB 11.9*  --   --   HCT 35.6*  --   --   PLT 272  --   --   LABPROT 28.2* 28.2* 31.2*  INR 2.69 2.69 3.08  CREATININE 1.48*  --  1.24    Estimated Creatinine Clearance: 33.3 mL/min (by C-G formula based on Cr of 1.24).   Medical History: Past Medical History  Diagnosis Date  . PVD (peripheral vascular disease) ([place])   . Hypertension   . DJD (degenerative joint disease)   . Ventricular tachycardia (paroxysmal) ([place])   . BPH (benign prostatic hyperplasia)   . Cardiomyopathy ([place])     Medications:  Warfarin 1 mg on Fridays and 2 mg all other days of the week.  Assessment: 1/28 INR 3.16 1/29: INR 2.69, Coumadin '2mg'$  1/30: INR 2.69, Coumadin '2mg'$  1/31: INR 3.08  Goal of Therapy:  INR 2.5 - 3.5 Monitor platelets by anticoagulation protocol: Yes   Plan:  INR is therapeutic will continue patients '2mg'$  dose today at 1800. INR ordered with AM labs.    Pharmacy will continue to follow.  Paulina Fusi, PharmD, BCPS 06/06/2015 1:53 PM

## 2015-06-06 NOTE — Clinical Social Work Placement (Signed)
   CLINICAL SOCIAL WORK PLACEMENT  NOTE  Date:  06/06/2015  Patient Details  Name: Daniel Huff MRN: [id number] Date of Birth: 19-Jun-1926  Clinical Social Work is seeking post-discharge placement for this patient at the [place] level of care (*CSW will initial, date and re-position this form in  chart as items are completed):  Yes   Patient/family provided with [place] Work Department's list of facilities offering this level of care within the geographic area requested by the patient (or if unable, by the patient's family).  Yes   Patient/family informed of their freedom to choose among providers that offer the needed level of care, that participate in Medicare, Medicaid or managed care program needed by the patient, have an available bed and are willing to accept the patient.  Yes   Patient/family informed of [place]'s ownership interest in [facility] and [facility], as well as of the fact that they are under no obligation to receive care at these facilities.  PASRR submitted to EDS on       PASRR number received on       Existing PASRR number confirmed on 06/06/15     FL2 transmitted to all facilities in geographic area requested by pt/family on 06/06/15     FL2 transmitted to all facilities within larger geographic area on 06/06/15     Patient informed that his/her managed care company has contracts with or will negotiate with certain facilities, including the following:        Yes   Patient/family informed of bed offers received.  [facility] bed at  ([facility] )     Physician recommends and patient chooses bed at      Patient to be transferred to  ([facility]) on 06/06/15.  Patient to be transferred to facility by  [facility] )     Patient family notified on 06/06/15 of transfer.  Name of family member notified:   Myrtis Hopping Garr Legal Nicoletta Dress and Olin Hauser- Daughter )     PHYSICIAN       Additional Comment:     _______________________________________________ Baldemar Lenis, LCSW 06/06/2015, 4:28 PM

## 2015-06-12 ENCOUNTER — Other Ambulatory Visit: Payer: Self-pay | Admitting: Family Medicine

## 2015-06-12 DIAGNOSIS — R918 Other nonspecific abnormal finding of lung field: Secondary | ICD-10-CM

## 2015-06-16 ENCOUNTER — Ambulatory Visit: Payer: Medicare Other

## 2015-06-20 ENCOUNTER — Inpatient Hospital Stay: Payer: Medicare Other | Admitting: Oncology

## 2015-06-29 ENCOUNTER — Other Ambulatory Visit
Admission: RE | Admit: 2015-06-29 | Discharge: 2015-06-29 | Disposition: A | Discharge status: Home / Self Care | Source: Ambulatory Visit | Attending: Family Medicine | Admitting: Family Medicine

## 2015-06-29 DIAGNOSIS — N390 Urinary tract infection, site not specified: Secondary | ICD-10-CM | POA: Insufficient documentation

## 2015-06-29 DIAGNOSIS — I4891 Unspecified atrial fibrillation: Secondary | ICD-10-CM | POA: Insufficient documentation

## 2015-06-29 LAB — PROTIME-INR
INR: 3.7
Prothrombin Time: 35.8 seconds — ABNORMAL HIGH (ref 11.4–15.0)

## 2015-06-29 LAB — COMPREHENSIVE METABOLIC PANEL
ALK PHOS: 123 U/L (ref 38–126)
ALT: 14 U/L — AB (ref 17–63)
AST: 28 U/L (ref 15–41)
Albumin: 4.1 g/dL (ref 3.5–5.0)
Anion gap: 10 (ref 5–15)
BILIRUBIN TOTAL: 1 mg/dL (ref 0.3–1.2)
BUN: 28 mg/dL — ABNORMAL HIGH (ref 6–20)
CALCIUM: 9.7 mg/dL (ref 8.9–10.3)
CO2: 30 mmol/L (ref 22–32)
CREATININE: 1.37 mg/dL — AB (ref 0.61–1.24)
Chloride: 100 mmol/L — ABNORMAL LOW (ref 101–111)
GFR calc non Af Amer: 45 mL/min — ABNORMAL LOW (ref >60)
GFR, EST AFRICAN AMERICAN: 52 mL/min — AB (ref >60)
GLUCOSE: 93 mg/dL (ref 65–99)
Potassium: 4.4 mmol/L (ref 3.5–5.1)
SODIUM: 140 mmol/L (ref 135–145)
TOTAL PROTEIN: 6.9 g/dL (ref 6.5–8.1)

## 2015-06-29 LAB — CBC WITH DIFFERENTIAL/PLATELET
Basophils Absolute: 0 10*3/uL (ref 0–0.1)
Basophils Relative: 1 %
EOS ABS: 0.3 10*3/uL (ref 0–0.7)
Eosinophils Relative: 4 %
HEMATOCRIT: 36.7 % — AB (ref 40.0–52.0)
HEMOGLOBIN: 12.1 g/dL — AB (ref 13.0–18.0)
LYMPHS ABS: 2.1 10*3/uL (ref 1.0–3.6)
Lymphocytes Relative: 28 %
MCH: 32.9 pg (ref 26.0–34.0)
MCHC: 33 g/dL (ref 32.0–36.0)
MCV: 99.5 fL (ref 80.0–100.0)
MONO ABS: 0.8 10*3/uL (ref 0.2–1.0)
MONOS PCT: 11 %
NEUTROS ABS: 4.3 10*3/uL (ref 1.4–6.5)
NEUTROS PCT: 56 %
PLATELETS: 248 10*3/uL (ref 150–440)
RBC: 3.69 MIL/uL — ABNORMAL LOW (ref 4.40–5.90)
RDW: 12.8 % (ref 11.5–14.5)
WBC: 7.6 10*3/uL (ref 3.8–10.6)

## 2015-07-04 ENCOUNTER — Inpatient Hospital Stay: Payer: Medicare Other | Admitting: Hematology and Oncology

## 2015-07-17 ENCOUNTER — Inpatient Hospital Stay: Payer: Medicare Other | Admitting: Hematology and Oncology

## 2015-07-20 ENCOUNTER — Emergency Department: Payer: Medicare Other

## 2015-07-20 ENCOUNTER — Encounter: Payer: Self-pay | Admitting: *Deleted

## 2015-07-20 ENCOUNTER — Emergency Department
Admission: EM | Admit: 2015-07-20 | Discharge: 2015-07-20 | Disposition: A | Discharge status: Home / Self Care | Payer: Medicare Other | Attending: Emergency Medicine | Admitting: Emergency Medicine

## 2015-07-20 DIAGNOSIS — Z87891 Personal history of nicotine dependence: Secondary | ICD-10-CM | POA: Diagnosis not present

## 2015-07-20 DIAGNOSIS — R0602 Shortness of breath: Secondary | ICD-10-CM | POA: Insufficient documentation

## 2015-07-20 DIAGNOSIS — Z79899 Other long term (current) drug therapy: Secondary | ICD-10-CM | POA: Diagnosis not present

## 2015-07-20 DIAGNOSIS — C3492 Malignant neoplasm of unspecified part of left bronchus or lung: Secondary | ICD-10-CM | POA: Insufficient documentation

## 2015-07-20 DIAGNOSIS — I10 Essential (primary) hypertension: Secondary | ICD-10-CM | POA: Diagnosis not present

## 2015-07-20 DIAGNOSIS — R05 Cough: Secondary | ICD-10-CM | POA: Diagnosis not present

## 2015-07-20 DIAGNOSIS — C3490 Malignant neoplasm of unspecified part of unspecified bronchus or lung: Secondary | ICD-10-CM

## 2015-07-20 DIAGNOSIS — Z7901 Long term (current) use of anticoagulants: Secondary | ICD-10-CM | POA: Insufficient documentation

## 2015-07-20 LAB — CBC WITH DIFFERENTIAL/PLATELET
BASOS PCT: 0 %
Basophils Absolute: 0 10*3/uL (ref 0–0.1)
Eosinophils Absolute: 0.2 10*3/uL (ref 0–0.7)
Eosinophils Relative: 3 %
HEMATOCRIT: 35.2 % — AB (ref 40.0–52.0)
Hemoglobin: 11.6 g/dL — ABNORMAL LOW (ref 13.0–18.0)
LYMPHS ABS: 1.7 10*3/uL (ref 1.0–3.6)
Lymphocytes Relative: 22 %
MCH: 32.1 pg (ref 26.0–34.0)
MCHC: 33.1 g/dL (ref 32.0–36.0)
MCV: 97.1 fL (ref 80.0–100.0)
MONO ABS: 0.6 10*3/uL (ref 0.2–1.0)
MONOS PCT: 9 %
NEUTROS ABS: 5 10*3/uL (ref 1.4–6.5)
Neutrophils Relative %: 66 %
Platelets: 239 10*3/uL (ref 150–440)
RBC: 3.62 MIL/uL — ABNORMAL LOW (ref 4.40–5.90)
RDW: 13.4 % (ref 11.5–14.5)
WBC: 7.6 10*3/uL (ref 3.8–10.6)

## 2015-07-20 LAB — BASIC METABOLIC PANEL
Anion gap: 4 — ABNORMAL LOW (ref 5–15)
BUN: 27 mg/dL — AB (ref 6–20)
CALCIUM: 9.5 mg/dL (ref 8.9–10.3)
CO2: 30 mmol/L (ref 22–32)
Chloride: 106 mmol/L (ref 101–111)
Creatinine, Ser: 1.45 mg/dL — ABNORMAL HIGH (ref 0.61–1.24)
GFR calc Af Amer: 49 mL/min — ABNORMAL LOW (ref >60)
GFR calc non Af Amer: 42 mL/min — ABNORMAL LOW (ref >60)
GLUCOSE: 132 mg/dL — AB (ref 65–99)
POTASSIUM: 4 mmol/L (ref 3.5–5.1)
Sodium: 140 mmol/L (ref 135–145)

## 2015-07-20 LAB — FIBRIN DERIVATIVES D-DIMER (ARMC ONLY): Fibrin derivatives D-dimer (ARMC): 495 (ref 0–499)

## 2015-07-20 LAB — PROTIME-INR
INR: 3.09
Prothrombin Time: 31.3 seconds — ABNORMAL HIGH (ref 11.4–15.0)

## 2015-07-20 LAB — TROPONIN I: Troponin I: 0.03 ng/mL (ref <0.031)

## 2015-07-20 MED ORDER — IPRATROPIUM-ALBUTEROL 0.5-2.5 (3) MG/3ML IN SOLN
3.0000 mL | Freq: Once | RESPIRATORY_TRACT | Status: AC
  Administered 2015-07-20: 3 mL via RESPIRATORY_TRACT
  Filled 2015-07-20: qty 3

## 2015-07-20 MED ORDER — IOHEXOL 350 MG/ML SOLN
60.0000 mL | Freq: Once | INTRAVENOUS | Status: AC | PRN
  Administered 2015-07-20: 60 mL via INTRAVENOUS

## 2015-07-20 MED ORDER — ALBUTEROL SULFATE HFA 108 (90 BASE) MCG/ACT IN AERS: 2.0000 | INHALATION_SPRAY | Freq: Four times a day (QID) | RESPIRATORY_TRACT | Status: DC | PRN

## 2015-07-20 NOTE — ED Notes (Addendum)
Pt given apple juice and graham crackers, pt repositioned in bed

## 2015-07-20 NOTE — ED Provider Notes (Addendum)
Time Seen: Approximately *0 9:30 I have reviewed the triage notes  Chief Complaint: Cough and Shortness of Breath   History of Present Illness: Daniel Huff is a 82 y.o. male who arrives by EMS from his group home with a three-day history of increasing shortness of breath. He states occasionally he feels like he is getting something "" caught in his throat "". He states a productive cough with yellow to white tinged phlegm no obvious fever at home. Patient denies any hemoptysis or chest pain. He denies any nausea, vomiting. Patient denies any back or flank pain. Patient denies any leg pain or swelling. Review of the records shows the patient has a history of multiple compression fractures and rib fractures and has a known lung mass which does not appear that that is been any intervention at this time.   Past Medical History  Diagnosis Date  . PVD (peripheral vascular disease) ([place])   . Hypertension   . DJD (degenerative joint disease)   . Ventricular tachycardia (paroxysmal) ([place])   . BPH (benign prostatic hyperplasia)   . Cardiomyopathy [facility])     Patient Active Problem List   Diagnosis Date Noted  . Protein-calorie malnutrition, severe 06/02/2015  . Lung mass 05/31/2015  . Multiple falls 05/31/2015  . Multiple rib fractures 05/31/2015  . Compression fracture of T12 vertebra ([place]) 05/31/2015  . Compression fracture of L4 lumbar vertebra ([place]) 05/31/2015  . HTN (hypertension) 05/31/2015  . Aortic valve replaced 05/31/2015  . CAD (coronary artery disease) 05/31/2015  . BPH (benign prostatic hyperplasia) 05/31/2015    Past Surgical History  Procedure Laterality Date  . Pacemaker placement    . Cardiac surgery      Past Surgical History  Procedure Laterality Date  . Pacemaker placement    . Cardiac surgery      Current Outpatient Rx  Name  Route  Sig  Dispense  Refill  . acetaminophen (TYLENOL) 325 MG tablet   Oral   Take 2 tablets (650 mg total) by mouth  every 6 (six) hours as needed.   30 tablet   2   . allopurinol (ZYLOPRIM) 100 MG tablet   Oral   Take 100 mg by mouth daily.         . Calcium Carbonate-Vitamin D (CALCIUM 600+D) 600-400 MG-UNIT per tablet   Oral   Take 1 tablet by mouth 2 (two) times daily with a meal.         . finasteride (PROSCAR) 5 MG tablet   Oral   Take 5 mg by mouth daily.         [place] Kitchen guaiFENesin (ROBITUSSIN) 100 MG/5ML liquid   Oral   Take 100 mg by mouth 2 (two) times daily as needed for cough.          . magnesium oxide (MAG-OX) 400 MG tablet   Oral   Take 400 mg by mouth daily.         . Melatonin 3 MG TABS   Oral   Take 3 mg by mouth at bedtime.         . metoprolol succinate (TOPROL-XL) 25 MG 24 hr tablet   Oral   Take 12.5 mg by mouth daily.         Marland Kitchen oxyCODONE (OXY IR/ROXICODONE) 5 MG immediate release tablet   Oral   Take 0.5 tablets (2.5 mg total) by mouth every 6 (six) hours as needed for moderate pain or severe pain.   20 tablet  0   . senna-docusate (SENOKOT-S) 8.6-50 MG tablet   Oral   Take 1 tablet by mouth daily.         . simvastatin (ZOCOR) 10 MG tablet   Oral   Take 5 mg by mouth at bedtime.         Marland Kitchen warfarin (COUMADIN) 1 MG tablet   Oral   Take 1 mg by mouth at bedtime. Pt only takes on Friday.         . warfarin (COUMADIN) 2 MG tablet   Oral   Take 2 mg by mouth at bedtime. Pt takes on Sunday, Monday, Tuesday, Wednesday, Thursday, and Saturday.           Allergies:  Aspirin  Family History: Family History  Problem Relation Age of Onset  . Heart failure Mother   . Heart failure Father     Social History: Social History  Substance Use Topics  . Smoking status: Former Smoker    Quit date: 05/06/2012  . Smokeless tobacco: None  . Alcohol Use: None     Review of Systems:   10 point review of systems was performed and was otherwise negative:  Constitutional: No fever Eyes: No visual disturbances ENT: No sore throat, ear  pain Cardiac: No chest pain Respiratory: Patient describes shortness of breath and occasionally states he feels like he has trouble swallowing. No trouble with his speech at all. Abdomen: No abdominal pain, no vomiting, No diarrhea Endocrine: No weight loss, No night sweats Extremities: No peripheral edema, cyanosis Skin: No rashes, easy bruising Neurologic: No focal weakness, trouble with speech or swollowing Urologic: No dysuria, Hematuria, or urinary frequency   Physical Exam:  ED Triage Vitals  Enc Vitals Group     BP 07/20/15 0934 143/84 mmHg     Pulse Rate 07/20/15 0934 82     Resp 07/20/15 0934 18     Temp 07/20/15 0934 97.6 F (36.4 C)     Temp Source 07/20/15 0934 Oral     SpO2 07/20/15 0932 96 %     Weight 07/20/15 0934 140 lb (63.504 kg)     Height 07/20/15 0934 '5\' 7"'$  (1.702 m)     Head Cir --      [facility] --      Pain Score 07/20/15 0935 3     Pain Loc --      Pain Edu? --      Excl. in [place]? --     General: Awake , Alert , and Oriented times 3; GCS 15 Head: Normal cephalic , atraumatic Eyes: Pupils equal , round, reactive to light Nose/Throat: No nasal drainage, patent upper airway without erythema or exudate.  Neck: Supple, Full range of motion, No anterior adenopathy or palpable thyroid masses Lungs: Clear to ascultation without wheezes , rhonchi, or rales Heart: Regular rate, regular rhythm without murmurs , gallops , or rubs Abdomen: Soft, non tender without rebound, guarding , or rigidity; bowel sounds positive and symmetric in all 4 quadrants. No organomegaly .        Extremities: 2 plus symmetric pulses. No edema, clubbing or cyanosis Neurologic: normal ambulation, Motor symmetric without deficits, sensory intact Skin: warm, dry, no rashes   Labs:   All laboratory work was reviewed including any pertinent negatives or positives listed below:  Labs Reviewed  BASIC METABOLIC PANEL  CBC WITH DIFFERENTIAL/PLATELET  TROPONIN I  FIBRIN DERIVATIVES  D-DIMER ([place])   ED ECG REPORT I, Daymon Larsen, the  attending physician, personally viewed and interpreted this ECG.  Date: 07/20/2015 EKG Time: *1026 Rate: 79 Rhythm: AV sequential dual-chamber pacemaker QRS Axis: normal Intervals: normal ST/T Wave abnormalities: normal Conduction Disturbances: none Narrative Interpretation: unremarkable  Radiology:EXAM: CT ANGIOGRAPHY CHEST WITH CONTRAST  TECHNIQUE: Multidetector CT imaging of the chest was performed using the standard protocol during bolus administration of intravenous contrast. Multiplanar CT image reconstructions and MIPs were obtained to evaluate the vascular anatomy.  CONTRAST: 65m OMNIPAQUE IOHEXOL 350 MG/ML SOLN IV  COMPARISON: 05/31/2015  FINDINGS: Extensive atherosclerotic calcifications aorta, coronary arteries, proximal great vessels.  Aneurysmal dilatation ascending thoracic aorta 4.4 cm diameter.  Pulmonary arteries well opacified and patent.  No evidence of pulmonary embolism.  Pacemaker leads RIGHT atrium and RIGHT ventricle.  Dilatation of cardiac chambers.  15 mm short axis subcarinal node slightly increased.  Additional scattered normal sized mediastinal and hilar nodes.  Probable LEFT renal cyst 17 x 16 mm image 140.  Remaining visualized upper abdomen normal.  Moderate pleural effusions bilaterally greater on LEFT.  Compressive atelectasis of posterior lower lobes bilaterally.  Persisted irregular spiculated density within the superior segment LEFT lower lobe concerning for tumor 2.3 x 1.6 x 1.7 cm image 45.  Central peribronchial thickening scattered atelectasis bilaterally.  Prior median sternotomy.  No definite additional mass/nodule.  Multiple thoracic spine compression fractures and osseous demineralization.  Old posterior RIGHT rib fractures.  Review of the MIP images confirms the above findings.  IMPRESSION: No evidence of pulmonary embolism.  Moderate  pleural effusions scattered atelectasis in both lungs.  Spiculated mass superior segment LEFT lower lobe 2.3 x 1.6 x 1.7 cm suspicious for pulmonary malignancy ; recommend nonemergent PET-CT imaging or tissue diagnosis.  Single mildly enlarged subcarinal lymph node.  Multiple thoracic spine compression fractures and osseous demineralization.  Extensive atherosclerotic changes with aneurysmal dilatation of the ascending aorta up to 4.4 cm diameter.  Recommend annual imaging followup by CTA or MRA. This recommendation follows 2010 ACCF/AHA/AATS/ACR/ASA/SCA/SCAI/SIR/STS/SVM Guidelines for the Diagnosis and Management of Patients with Thoracic Aortic Disease. Circulation. 2010; 121:: [id number]  I personally reviewed the radiologic studies    ED Course: The patient's CAT scan does not show any evidence of acute pathology such as a pulmonary embolism or pneumonia. The patient per record we cannot find any outpatient follow-up since his recent evaluation here back in January , where it was decided that he had this left-sided lung mass . [facility]'s followed by the [facility] I will try referring him to a local primary oncologist. Patient had some mild symptomatic improvement with a DuoNeb here in emergency department. I'll discharge him with an inhaler but he is given and need outpatient follow-up with oncology or even hospice care if his determined that there is not good to be any further intervention of his lung mass. Patient's shortness of breath is not going to improve based on his results not only today but on the past in January.  Assessment: * Primary lung cancer  Final Clinical Impression:  Final diagnoses:  Shortness of breath     Plan:  Outpatient Patient was advised to return immediately if condition worsens. Patient was advised to follow up with their primary care physician or other specialized physicians involved in their outpatient care. The patient and/or family member/power  of attorney had laboratory results reviewed at the bedside. All questions and concerns were addressed and appropriate discharge instructions were distributed by the nursing staff.  Daymon Larsen, MD 07/20/15 1440  Daymon Larsen, MD 07/20/15 [phone]

## 2015-07-20 NOTE — ED Notes (Signed)
Pt repositioned in bed, resting in bed in no acute distress, resp even and unlabored

## 2015-07-20 NOTE — ED Notes (Signed)
Pt arrives via EMS from a group home, pt states he feels like "something is caught in his throat", states productive cough with yellow/ white phlgem, pt awake and alert upon arrival in no acute distress

## 2015-07-20 NOTE — ED Notes (Signed)
Pt clothes wet with urine, pts pants put in belongings bag, pt cleaned and put in diaper

## 2015-07-20 NOTE — ED Notes (Signed)
Report given to [facility], transportation arranged for pt pickup, discharge instructions and RX reviewed with pt, pt states understanding

## 2015-07-20 NOTE — Discharge Instructions (Signed)
Lung Cancer °Lung cancer occurs when abnormal cells in the lung grow out of control and form a mass (tumor). There are several types of lung cancer. The two most common types are: °· Non-small cell. In this type of lung cancer, abnormal cells are larger and grow more slowly than those of small cell lung cancer. °· Small cell. In this type of lung cancer, abnormal cells are smaller than those of non-small cell lung cancer. Small cell lung cancer gets worse faster than non-small cell lung cancer. °CAUSES  °The leading cause of lung cancer is smoking tobacco. The second leading cause is radon exposure. °RISK FACTORS °· Smoking tobacco. °· Exposure to secondhand tobacco smoke. °· Exposure to radon gas. °· Exposure to asbestos. °· Exposure to arsenic in drinking water. °· Air pollution. °· Family or personal history of lung cancer. °· Lung radiation therapy. °· Being older than 65 years. °SIGNS AND SYMPTOMS  °In the early stages, symptoms may not be present. As the cancer progresses, symptoms may include: °· A lasting cough, possibly with blood. °· Fatigue. °· Unexplained weight loss. °· Shortness of breath. °· Wheezing. °· Chest pain. °· Loss of appetite. °Symptoms of advanced lung cancer include: °· Hoarseness. °· Bone or joint pain. °· Weakness. °· Nail problems. °· Face or arm swelling. °· Paralysis of the face. °· Drooping eyelids. °DIAGNOSIS  °Lung cancer can be identified with a physical exam and with tests such as: °· A chest X-ray. °· A CT scan. °· Blood tests. °· A biopsy. °After a diagnosis is made, you will have more tests to determine the stage of the cancer. The stages of non-small cell lung cancer are: °· Stage 0, also called carcinoma in situ. At this stage, abnormal cells are found in the inner lining of your lung or lungs. °· Stage I. At this stage, abnormal cells have grown into a tumor that is no larger than 5 cm across. The cancer has entered the deeper lung tissue but has not yet entered the lymph  nodes or other parts of the body. °· Stage II. At this stage, the tumor is 7 cm across or smaller and has entered nearby lymph nodes. Or, the tumor is 5 cm across or smaller and has invaded surrounding tissue but is not found in nearby lymph nodes. There may be more than one tumor present. °· Stage III. At this stage, the tumor may be any size. There may be more than one tumor in the lungs. The cancer cells have spread to the lymph nodes and possibly to other organs. °· Stage IV. At this stage, there are tumors in both lungs and the cancer has spread to other areas of the body. °The stages of small cell lung cancer are: °· Limited. At this stage, the cancer is found only on one side of the chest. °· Extensive. At this stage, the cancer is in the lungs and in tissues on the other side of the chest. The cancer has spread to other organs or is found in the fluid between the layers of your lungs. °TREATMENT  °Depending on the type and stage of your lung cancer, you may be treated with: °· Surgery. This is done to remove a tumor. °· Radiation therapy. This treatment destroys cancer cells using X-rays or other types of radiation. °· Chemotherapy. This treatment uses medicines to destroy cancer cells. °· Targeted therapy. This treatment aims to destroy only cancer cells instead of all cells as other therapies do. °You may   also have a combination of treatments. HOME CARE INSTRUCTIONS   Do not use any tobacco products. This includes cigarettes, chewing tobacco, and electronic cigarettes. If you need help quitting, ask your health care provider.  Take medicines only as directed by your health care provider.  Eat a healthy diet. Work with a dietitian to make sure you are getting the nutrition you need.  Consider joining a support group or seeking counseling to help you cope with the stress of having lung cancer.  Let your cancer specialist (oncologist) know if you are admitted to the hospital.  Keep all follow-up  visits as directed by your health care provider. This is important. SEEK MEDICAL CARE IF:   You lose weight without trying.  You have a persistent cough and wheezing.  You feel short of breath.  You tire easily.  You experience bone or joint pain.  You have difficulty swallowing.  You feel hoarse or notice your voice changing.  Your pain medicine is not helping. SEEK IMMEDIATE MEDICAL CARE IF:   You cough up blood.  You have new breathing problems.  You develop chest pain.  You develop swelling in:  One or both ankles or legs.  Your face, neck, or arms.  You are confused.  You experience paralysis in your face or a drooping eyelid.   This information is not intended to replace advice given to you by your health care provider. Make sure you discuss any questions you have with your health care provider.   Document Released: 07/29/2000 Document Revised: 01/11/2015 Document Reviewed: 08/26/2013 Elsevier Interactive Patient Education [employer].  Please return immediately if condition worsens. Please contact her primary physician or the physician you were given for referral. If you have any specialist physicians involved in her treatment and plan please also contact them. Thank you for using [facility] emergency [facility].  The patient has known lung cancer and shortness of breath is unlikely to improve. Return the patient to the emergency department for a fever, persistent vomiting, or any new concerns. Patient's followed by the [facility] system and likely will need consultation and referral to the [facility]. He was given referral to local oncology.

## 2015-08-03 ENCOUNTER — Inpatient Hospital Stay: Payer: Medicare Other | Attending: Oncology | Admitting: Hematology and Oncology

## 2015-08-03 VITALS — BP 169/94 | HR 73 | Temp 95.4°F | Resp 17 | Ht 67.0 in | Wt 129.2 lb

## 2015-08-03 DIAGNOSIS — I739 Peripheral vascular disease, unspecified: Secondary | ICD-10-CM | POA: Diagnosis not present

## 2015-08-03 DIAGNOSIS — R918 Other nonspecific abnormal finding of lung field: Secondary | ICD-10-CM | POA: Insufficient documentation

## 2015-08-03 DIAGNOSIS — M1990 Unspecified osteoarthritis, unspecified site: Secondary | ICD-10-CM | POA: Insufficient documentation

## 2015-08-03 DIAGNOSIS — R0602 Shortness of breath: Secondary | ICD-10-CM | POA: Insufficient documentation

## 2015-08-03 DIAGNOSIS — M8580 Other specified disorders of bone density and structure, unspecified site: Secondary | ICD-10-CM | POA: Diagnosis not present

## 2015-08-03 DIAGNOSIS — I10 Essential (primary) hypertension: Secondary | ICD-10-CM

## 2015-08-03 DIAGNOSIS — I429 Cardiomyopathy, unspecified: Secondary | ICD-10-CM | POA: Insufficient documentation

## 2015-08-03 DIAGNOSIS — N400 Enlarged prostate without lower urinary tract symptoms: Secondary | ICD-10-CM

## 2015-08-03 DIAGNOSIS — Z87891 Personal history of nicotine dependence: Secondary | ICD-10-CM | POA: Diagnosis not present

## 2015-08-03 DIAGNOSIS — R634 Abnormal weight loss: Secondary | ICD-10-CM

## 2015-08-03 DIAGNOSIS — Z79899 Other long term (current) drug therapy: Secondary | ICD-10-CM | POA: Diagnosis not present

## 2015-08-03 DIAGNOSIS — Z7901 Long term (current) use of anticoagulants: Secondary | ICD-10-CM | POA: Diagnosis not present

## 2015-08-03 DIAGNOSIS — D649 Anemia, unspecified: Secondary | ICD-10-CM | POA: Diagnosis not present

## 2015-08-03 DIAGNOSIS — Z86718 Personal history of other venous thrombosis and embolism: Secondary | ICD-10-CM | POA: Insufficient documentation

## 2015-08-03 NOTE — Progress Notes (Signed)
Pt reports being SOB at sometimes even at rest, a cough that sometimes he coughs up something and is mostly yellow.  At this time stated he fell about 3 weeks ago.  Fall precauyions and armband placed

## 2015-08-03 NOTE — Progress Notes (Signed)
[facility] day:  08/03/2015  Chief Complaint: Jawanza Zambito is a 82 y.o. male with a left lower lobe pulmonary nodule who is seen for reassessment after recent hospitalization.  HPI:   The patient was admitted to [facility] from 05/31/2015 - 05/31/2015.  He presented with right sided chest pain and back pain after a fall. CXR revealed diffuse osteopenia. Head CT revealed atrophy with prior small infarcts. Cervical spine CT revealed extensive multifocal osteoarthritic changes. Chest CT on 05/31/2015 revealed acute and subacute rib fractures, multiple thoracic compression deformities, and a 2.3 cm spiculated mass in the superior segment of the left lower lobe. Lumbar spine CT revealed a 1.2 cm right lower lobe subpleural mass versus round atelectasis.  He was seen in consultation during his admission.  We discussed imaging worrisome for primary lung cancer.  We discussed outpatient PET scan and biopsy.  He wanted to get treatment at the [facility].  He was noted to have mild anemia (hematocrit 37.6 with a hemoglobin of 12.2).  Ferritin was 161.  Iron studies included a saturation of 11% and TIBC 215 (low).  B12 was 311 (low normal).  Folate was 16.4.  He was discharged to [facility] for rehabilitation.  He then represented to the ER on 07/20/2015 with cough and shortness of breath.  Chest CT angiogram revealed no evidence of pulmonary embolism.  He had a persistent 2.3 cm LLL spiculated mass and a mildly enlarged single subcarinal node.  He was treated with a DuoNeb and discharged on an inhaler.  He states that he has not followed up with the [facility].  He had a checkup last week with Dr  Niger Reid and was referred to pulmonary medicine.  He has an appointment on 08/09/2015.  Symptomatically, he states has shortness of breath with walking. He lives in an assisted living facility. He has a 25 pack year smoking history. He stopped smoking in  2014. Over an unspecified period of time, he lost 10 pounds.  His appetite is good. Meals are prepared for him.   Past Medical History  Diagnosis Date  . PVD (peripheral vascular disease) ([place])   . Hypertension   . DJD (degenerative joint disease)   . Ventricular tachycardia (paroxysmal) ([place])   . BPH (benign prostatic hyperplasia)   . Cardiomyopathy [facility])     Past Surgical History  Procedure Laterality Date  . Pacemaker placement    . Cardiac surgery      Family History  Problem Relation Age of Onset  . Heart failure Mother   . Heart failure Father     Social History:  reports that he quit smoking about 3 years ago. He does not have any smokeless tobacco history on file. His alcohol and drug histories are not on file.  He lives at [facility].  The patient is accompanied by Eduard Roux, his legal guardian, and Jenny Reichmann from [facility], today.  Allergies:  Allergies  Allergen Reactions  . Aspirin Other (See Comments)    Reaction:  Unknown     Current Medications: Current Outpatient Prescriptions  Medication Sig Dispense Refill  . acetaminophen (TYLENOL) 325 MG tablet Take 2 tablets (650 mg total) by mouth every 6 (six) hours as needed. 30 tablet 2  . albuterol (PROVENTIL HFA;VENTOLIN HFA) 108 (90 Base) MCG/ACT inhaler Inhale 2 puffs into the lungs every 6 (six) hours as needed for wheezing or shortness of breath. 1 Inhaler 2  .  allopurinol (ZYLOPRIM) 100 MG tablet Take 100 mg by mouth daily.    . Calcium Carbonate-Vitamin D (CALCIUM 600+D) 600-400 MG-UNIT per tablet Take 1 tablet by mouth 2 (two) times daily with a meal.    . finasteride (PROSCAR) 5 MG tablet Take 5 mg by mouth daily.    . magnesium oxide (MAG-OX) 400 MG tablet Take 400 mg by mouth daily.    . Melatonin 3 MG TABS Take 3 mg by mouth at bedtime.    . metoprolol succinate (TOPROL-XL) 25 MG 24 hr tablet Take 12.5 mg by mouth daily.    Marland Kitchen oxyCODONE (OXY IR/ROXICODONE) 5 MG immediate release  tablet Take 0.5 tablets (2.5 mg total) by mouth every 6 (six) hours as needed for moderate pain or severe pain. 20 tablet 0  . senna-docusate (SENOKOT-S) 8.6-50 MG tablet Take 1 tablet by mouth daily.    . simvastatin (ZOCOR) 5 MG tablet Take 5 mg by mouth at bedtime.     . triamcinolone (KENALOG) 0.025 % cream Apply 1 application topically 2 (two) times daily as needed (for rash on back).    . warfarin (COUMADIN) 1 MG tablet Take 1 mg by mouth at bedtime. Pt only takes on Friday.    . warfarin (COUMADIN) 2 MG tablet Take 2 mg by mouth at bedtime. Pt takes on Sunday, Monday, Tuesday, Wednesday, Thursday, and Saturday.     No current facility-administered medications for this visit.    Review of Systems:  GENERAL:  Fatigue.  No fevers or sweats.  Weight loss of 10 pounds over an unspecified period of time. PERFORMANCE STATUS (ECOG):  2-3 HEENT:  No visual changes, runny nose, sore throat, mouth sores or tenderness. Lungs:  Shortness of breath with walking slow.  Cough.  No hemoptysis. Cardiac:  No chest pain, palpitations, orthopnea, or PND. GI:  No nausea, vomiting, diarrhea, constipation, melena or hematochezia. GU:  No urgency, frequency, dysuria, or hematuria. Musculoskeletal:  No back pain.  No joint pain.  No muscle tenderness. Extremities:  No pain or swelling. Skin:  No rashes or skin changes. Neuro:  No headache, numbness or weakness, balance or coordination issues. Endocrine:  No diabetes, thyroid issues, hot flashes or night sweats. Psych:  No mood changes, depression or anxiety. Pain:  No focal pain. Review of systems:  All other systems reviewed and found to be negative.  Physical Exam: Blood pressure 169/94, pulse 73, temperature 95.4 F (35.2 C), temperature source Tympanic, resp. rate 17, height '5\' 7"'$  (1.702 m), weight 129 lb 3 oz (58.6 kg). GENERAL:  Elderly gentleman sitting comfortably in the exam room with his head down in no acute distress. MENTAL STATUS:  Alert and  oriented to person, place and time. HEAD:  Wearing a [employer] cap.  Gray hair.  Normocephalic, atraumatic, face symmetric, no Cushingoid features. EYES:  Glasses.  Blue eyes.  Pupils equal round and reactive to light and accomodation.  No conjunctivitis or scleral icterus. ENT:  Oropharynx clear without lesion.  Tongue normal. Mucous membranes moist.  RESPIRATORY:  Clear to auscultation without rales, wheezes or rhonchi. CARDIOVASCULAR:  Regular rate and rhythm without murmur, rub or gallop. ABDOMEN:  Soft, non-tender, with active bowel sounds, and no hepatosplenomegaly.  No masses. SKIN:  No rashes, ulcers or lesions. EXTREMITIES: Thin.  No edema, no skin discoloration or tenderness.  No palpable cords. LYMPH NODES: No palpable cervical, supraclavicular, axillary or inguinal adenopathy  NEUROLOGICAL: Unremarkable. PSYCH:  Appropriate.  No visits with results within 3 Day(s) from  this visit. Latest known visit with results is:  Admission on 07/20/2015, Discharged on 07/20/2015  Component Date Value Ref Range Status  . Sodium 07/20/2015 140  135 - 145 mmol/L Final  . Potassium 07/20/2015 4.0  3.5 - 5.1 mmol/L Final  . Chloride 07/20/2015 106  101 - 111 mmol/L Final  . CO2 07/20/2015 30  22 - 32 mmol/L Final  . Glucose, Bld 07/20/2015 132* 65 - 99 mg/dL Final  . BUN 07/20/2015 27* 6 - 20 mg/dL Final  . Creatinine, Ser 07/20/2015 1.45* 0.61 - 1.24 mg/dL Final  . Calcium 07/20/2015 9.5  8.9 - 10.3 mg/dL Final  . GFR calc non Af Amer 07/20/2015 42* >60 mL/min Final  . GFR calc Af Amer 07/20/2015 49* >60 mL/min Final   Comment: (NOTE) The eGFR has been calculated using the CKD EPI equation. This calculation has not been validated in all clinical situations. eGFR's persistently <60 mL/min signify possible Chronic Kidney Disease.   . Anion gap 07/20/2015 4* 5 - 15 Final  . WBC 07/20/2015 7.6  3.8 - 10.6 K/uL Final  . RBC 07/20/2015 3.62* 4.40 - 5.90 MIL/uL Final  . Hemoglobin 07/20/2015  11.6* 13.0 - 18.0 g/dL Final  . HCT 07/20/2015 35.2* 40.0 - 52.0 % Final  . MCV 07/20/2015 97.1  80.0 - 100.0 fL Final  . MCH 07/20/2015 32.1  26.0 - 34.0 pg Final  . MCHC 07/20/2015 33.1  32.0 - 36.0 g/dL Final  . RDW 07/20/2015 13.4  11.5 - 14.5 % Final  . Platelets 07/20/2015 239  150 - 440 K/uL Final  . Neutrophils Relative % 07/20/2015 66   Final  . Neutro Abs 07/20/2015 5.0  1.4 - 6.5 K/uL Final  . Lymphocytes Relative 07/20/2015 22   Final  . Lymphs Abs 07/20/2015 1.7  1.0 - 3.6 K/uL Final  . Monocytes Relative 07/20/2015 9   Final  . Monocytes Absolute 07/20/2015 0.6  0.2 - 1.0 K/uL Final  . Eosinophils Relative 07/20/2015 3   Final  . Eosinophils Absolute 07/20/2015 0.2  0 - 0.7 K/uL Final  . Basophils Relative 07/20/2015 0   Final  . Basophils Absolute 07/20/2015 0.0  0 - 0.1 K/uL Final  . Troponin I 07/20/2015 0.03  <0.031 ng/mL Final   Comment:        NO INDICATION OF MYOCARDIAL INJURY.   . Fibrin derivatives D-dimer ([facility]) 07/20/2015 495  0 - 499 Final   Comment: <> Exclusion of Venous Thromboembolism (VTE) - OUTPATIENTS ONLY        (Emergency Department or [place])             0-499 ng/ml (FEU)  : With a low to intermediate pretest                                        probability for VTE this test result                                        excludes the diagnosis of VTE.           > 499 ng/ml (FEU)  : VTE not excluded.  Additional work up  for VTE is required.   <>  Testing on Inpatients and Evaluation of Disseminated Intravascular        Coagulation (DIC)             Reference Range:   0-499 ng/ml (FEU)   . Prothrombin Time 07/20/2015 31.3* 11.4 - 15.0 seconds Final  . INR 07/20/2015 3.09   Final    Assessment:  Everette Aqib Lough is a 82 y.o. male with 25 pack year smoking history.  He presented with a lung mass discovered on CXR following a fall.  Chest CT on 05/31/2015 revealed acute and subacute rib fractures,  multiple thoracic compression deformities, and a 2.3 cm spiculated mass in the superior segment of the left lower lobe.  He has a a mild normocytic anemia (hematocrit 37.6 with a hemoglobin of 12.2).  Normal studies included ferritin (161) and folate (16.4).  Iron studies included a saturation of 11% (low) and TIBC 215 (low) consistent with chronic disease.  B12 was 311 (low normal).  He lives in an assisted care facility.  He receives some of his care at the [facility] in [place].  He is on Coumadin.  Symptomatically, he notes shortness of breath with minimal exertion.  Performance status is 2-3.   Plan:  1. Re-review imaging studies with patient. Discuss concern for malignancy (lung cancer or metastatic disease) with suspicion for primary lung cancer. Patient has appointment with pulmonary medicine.  Schedule outpatient PET scan.  Anticipate biopsy. Discuss patient is a poor candidate for surgery even if early lung cancer given his age, health, and performance status. Discuss possible stereotactic radiosurgery if lung cancer documented and no lymph nodes involved. 2. Discuss anemia work-up.  Check MMA at next lab check (r/o B12 defciency). 3.  Anticipate pulmonary medicine consult on 08/09/2015. 4.  PET scan:  2.3 cm left lower lobe pulmonary nodule. 5.  RTC in 1 week for MD assessment and review of interval PET scan after follow-up with pulmonary medicine.   Lequita Asal, MD  08/03/2015, 10:43 AM

## 2015-08-13 ENCOUNTER — Encounter: Payer: Self-pay | Admitting: Hematology and Oncology

## 2015-08-14 ENCOUNTER — Inpatient Hospital Stay: Payer: Medicare Other | Attending: Hematology and Oncology | Admitting: Hematology and Oncology

## 2015-08-14 VITALS — BP 146/88 | HR 85 | Temp 95.3°F | Resp 18

## 2015-08-14 DIAGNOSIS — Z79899 Other long term (current) drug therapy: Secondary | ICD-10-CM | POA: Diagnosis not present

## 2015-08-14 DIAGNOSIS — I472 Ventricular tachycardia: Secondary | ICD-10-CM | POA: Insufficient documentation

## 2015-08-14 DIAGNOSIS — R918 Other nonspecific abnormal finding of lung field: Secondary | ICD-10-CM | POA: Insufficient documentation

## 2015-08-14 DIAGNOSIS — D649 Anemia, unspecified: Secondary | ICD-10-CM | POA: Diagnosis not present

## 2015-08-14 DIAGNOSIS — M1990 Unspecified osteoarthritis, unspecified site: Secondary | ICD-10-CM | POA: Insufficient documentation

## 2015-08-14 DIAGNOSIS — I10 Essential (primary) hypertension: Secondary | ICD-10-CM | POA: Diagnosis not present

## 2015-08-14 DIAGNOSIS — R0602 Shortness of breath: Secondary | ICD-10-CM | POA: Diagnosis not present

## 2015-08-14 DIAGNOSIS — Z87891 Personal history of nicotine dependence: Secondary | ICD-10-CM | POA: Insufficient documentation

## 2015-08-14 DIAGNOSIS — N400 Enlarged prostate without lower urinary tract symptoms: Secondary | ICD-10-CM | POA: Diagnosis not present

## 2015-08-14 DIAGNOSIS — Z8701 Personal history of pneumonia (recurrent): Secondary | ICD-10-CM

## 2015-08-14 DIAGNOSIS — I429 Cardiomyopathy, unspecified: Secondary | ICD-10-CM | POA: Diagnosis not present

## 2015-08-14 DIAGNOSIS — I739 Peripheral vascular disease, unspecified: Secondary | ICD-10-CM | POA: Diagnosis not present

## 2015-08-14 DIAGNOSIS — Z9181 History of falling: Secondary | ICD-10-CM | POA: Insufficient documentation

## 2015-08-14 DIAGNOSIS — Z7901 Long term (current) use of anticoagulants: Secondary | ICD-10-CM | POA: Insufficient documentation

## 2015-08-14 NOTE — Progress Notes (Signed)
[facility] day:  08/14/2015  Chief Complaint: Daniel Huff is a 82 y.o. male with a left lower lobe pulmonary nodule who is seen for review of interval PET scan.  HPI:   The patient was last seen in the medical oncology clinic on 08/03/2015 for reassessment after recent hospitalization.  We discussed the concern for malignancy (lung cancer or metastatic disease) with suspicion for primary lung cancer. Patient had an appointment with pulmonary medicine on 08/09/2015.  An outpatient PET scan was scheduled.   The patient has not had a PET scan.  He is unclear if he met with pulmonary medicine.  He states that his Coumadin was adjusted (INR was 1.69 on 08/09/2015) to 2 mg a day.  He notes that his appetite is good.  He is eating "so-so".  He denies any chest pain or shortness of breath.  He feels that his breathing is better using an inhaler.   Past Medical History  Diagnosis Date  . PVD (peripheral vascular disease) ([place])   . Hypertension   . DJD (degenerative joint disease)   . Ventricular tachycardia (paroxysmal) ([place])   . BPH (benign prostatic hyperplasia)   . Cardiomyopathy [facility])     Past Surgical History  Procedure Laterality Date  . Pacemaker placement    . Cardiac surgery      Family History  Problem Relation Age of Onset  . Heart failure Mother   . Heart failure Father     Social History:  reports that he quit smoking about 3 years ago. He does not have any smokeless tobacco history on file. His alcohol and drug histories are not on file.  He lives at [facility].  Eduard Roux, is his legal guardian.  He is accompanied by Lattie Haw, a med tech from [facility], today.  Allergies:  Allergies  Allergen Reactions  . Aspirin Other (See Comments)    Reaction:  Unknown     Current Medications: Current Outpatient Prescriptions  Medication Sig Dispense Refill  . acetaminophen (TYLENOL) 325 MG tablet Take  2 tablets (650 mg total) by mouth every 6 (six) hours as needed. 30 tablet 2  . albuterol (PROVENTIL HFA;VENTOLIN HFA) 108 (90 Base) MCG/ACT inhaler Inhale 2 puffs into the lungs every 6 (six) hours as needed for wheezing or shortness of breath. 1 Inhaler 2  . allopurinol (ZYLOPRIM) 100 MG tablet Take 100 mg by mouth daily.    . Calcium Carbonate-Vitamin D (CALCIUM 600+D) 600-400 MG-UNIT per tablet Take 1 tablet by mouth 2 (two) times daily with a meal.    . finasteride (PROSCAR) 5 MG tablet Take 5 mg by mouth daily.    . magnesium oxide (MAG-OX) 400 MG tablet Take 400 mg by mouth daily.    . Melatonin 3 MG TABS Take 3 mg by mouth at bedtime.    . metoprolol succinate (TOPROL-XL) 25 MG 24 hr tablet Take 12.5 mg by mouth daily.    Marland Kitchen oxyCODONE (OXY IR/ROXICODONE) 5 MG immediate release tablet Take 0.5 tablets (2.5 mg total) by mouth every 6 (six) hours as needed for moderate pain or severe pain. 20 tablet 0  . senna-docusate (SENOKOT-S) 8.6-50 MG tablet Take 1 tablet by mouth daily.    . simvastatin (ZOCOR) 5 MG tablet Take 5 mg by mouth at bedtime.     . triamcinolone (KENALOG) 0.025 % cream Apply 1 application topically 2 (two) times daily as needed (for rash on back).    Marland Kitchen  warfarin (COUMADIN) 1 MG tablet Take 2 mg by mouth daily.     No current facility-administered medications for this visit.    Review of Systems:  GENERAL:  Chronic fatigue.  No fevers or sweats.  Weight loss.  Unable to obtain weight today. PERFORMANCE STATUS (ECOG):  2-3 HEENT:  No visual changes, runny nose, sore throat, mouth sores or tenderness. Lungs:  Shortness of breath (chronic).  Cough.  No hemoptysis. Cardiac:  No chest pain, palpitations, orthopnea, or PND. GI:  No nausea, vomiting, diarrhea, constipation, melena or hematochezia. GU:  No urgency, frequency, dysuria, or hematuria. Musculoskeletal:  No back pain.  No joint pain.  No muscle tenderness. Extremities:  No pain or swelling. Skin:  No rashes or skin  changes. Neuro:  No headache, numbness or weakness, balance or coordination issues. Endocrine:  No diabetes, thyroid issues, hot flashes or night sweats. Psych:  No mood changes, depression or anxiety. Pain:  No focal pain. Review of systems:  All other systems reviewed and found to be negative.  Physical Exam: Blood pressure 146/88, pulse 85, temperature 95.3 F (35.2 C), temperature source Tympanic, resp. rate 18. GENERAL:  Elderly gentleman sitting comfortably in the exam room with his head down in no acute distress.  Smells of urine. MENTAL STATUS:  Alert and oriented to person, place and time. HEAD:  Wearing a [employer] cap.  Short gray hair.  Normocephalic, atraumatic, face symmetric, no Cushingoid features. EYES:  Gold rimmed glasses.  Blue eyes.  Pupils equal round and reactive to light and accomodation.  No conjunctivitis or scleral icterus. ENT:  Oropharynx clear without lesion.  Tongue normal. Mucous membranes moist.  RESPIRATORY:  Clear to auscultation without rales, wheezes or rhonchi. CARDIOVASCULAR:  Regular rate and rhythm without murmur, rub or gallop. ABDOMEN:  Soft, non-tender, with active bowel sounds, and no hepatosplenomegaly.  No masses. SKIN:  No rashes, ulcers or lesions. EXTREMITIES:  Lower extremity 1+ edema (left > right).  No skin discoloration or tenderness.  No palpable cords. LYMPH NODES: No palpable cervical, supraclavicular, axillary or inguinal adenopathy  NEUROLOGICAL: Unremarkable. PSYCH:  Appropriate.  No visits with results within 3 Day(s) from this visit. Latest known visit with results is:  Admission on 07/20/2015, Discharged on 07/20/2015  Component Date Value Ref Range Status  . Sodium 07/20/2015 140  135 - 145 mmol/L Final  . Potassium 07/20/2015 4.0  3.5 - 5.1 mmol/L Final  . Chloride 07/20/2015 106  101 - 111 mmol/L Final  . CO2 07/20/2015 30  22 - 32 mmol/L Final  . Glucose, Bld 07/20/2015 132* 65 - 99 mg/dL Final  . BUN 07/20/2015 27* 6 -  20 mg/dL Final  . Creatinine, Ser 07/20/2015 1.45* 0.61 - 1.24 mg/dL Final  . Calcium 07/20/2015 9.5  8.9 - 10.3 mg/dL Final  . GFR calc non Af Amer 07/20/2015 42* >60 mL/min Final  . GFR calc Af Amer 07/20/2015 49* >60 mL/min Final   Comment: (NOTE) The eGFR has been calculated using the CKD EPI equation. This calculation has not been validated in all clinical situations. eGFR's persistently <60 mL/min signify possible Chronic Kidney Disease.   . Anion gap 07/20/2015 4* 5 - 15 Final  . WBC 07/20/2015 7.6  3.8 - 10.6 K/uL Final  . RBC 07/20/2015 3.62* 4.40 - 5.90 MIL/uL Final  . Hemoglobin 07/20/2015 11.6* 13.0 - 18.0 g/dL Final  . HCT 07/20/2015 35.2* 40.0 - 52.0 % Final  . MCV 07/20/2015 97.1  80.0 - 100.0 fL Final  .  MCH 07/20/2015 32.1  26.0 - 34.0 pg Final  . MCHC 07/20/2015 33.1  32.0 - 36.0 g/dL Final  . RDW 07/20/2015 13.4  11.5 - 14.5 % Final  . Platelets 07/20/2015 239  150 - 440 K/uL Final  . Neutrophils Relative % 07/20/2015 66   Final  . Neutro Abs 07/20/2015 5.0  1.4 - 6.5 K/uL Final  . Lymphocytes Relative 07/20/2015 22   Final  . Lymphs Abs 07/20/2015 1.7  1.0 - 3.6 K/uL Final  . Monocytes Relative 07/20/2015 9   Final  . Monocytes Absolute 07/20/2015 0.6  0.2 - 1.0 K/uL Final  . Eosinophils Relative 07/20/2015 3   Final  . Eosinophils Absolute 07/20/2015 0.2  0 - 0.7 K/uL Final  . Basophils Relative 07/20/2015 0   Final  . Basophils Absolute 07/20/2015 0.0  0 - 0.1 K/uL Final  . Troponin I 07/20/2015 0.03  <0.031 ng/mL Final   Comment:        NO INDICATION OF MYOCARDIAL INJURY.   . Fibrin derivatives D-dimer ([facility]) 07/20/2015 495  0 - 499 Final   Comment: <> Exclusion of Venous Thromboembolism (VTE) - OUTPATIENTS ONLY        (Emergency Department or [place])             0-499 ng/ml (FEU)  : With a low to intermediate pretest                                        probability for VTE this test result                                        excludes the  diagnosis of VTE.           > 499 ng/ml (FEU)  : VTE not excluded.  Additional work up                                   for VTE is required.   <>  Testing on Inpatients and Evaluation of Disseminated Intravascular        Coagulation ([facility]IC)             Reference Range:   0-499 ng/ml (FEU)   . Prothrombin Time 07/20/2015 31.3* 11.4 - 15.0 seconds Final  . INR 07/20/2015 3.09   Final    Assessment:  Daniel Huff is a 82 y.o. male with 25 pack year smoking history.  He presented with a lung mass discovered on CXR following a fall.  Chest CT on 05/31/2015 revealed acute and subacute rib fractures, multiple thoracic compression deformities, and a 2.3 cm spiculated mass in the superior segment of the left lower lobe.  He has a a mild normocytic anemia (hematocrit 37.6 with a hemoglobin of 12.2).  Normal studies included ferritin (161) and folate (16.4).  Iron studies included a saturation of 11% (low) and TIBC 215 (low) consistent with chronic disease.  B12 was 311 (low normal).  He lives in an assisted care facility.  He receives some of his care at the [facility] in [place].  He is on Coumadin.  Symptomatically, he has shortness of breath with minimal exertion.  Performance status is 2-3.   Plan:  1. Discuss  plan for PET scan.  Reschedule imaging.  Coordinate care with PCP and pulmonary medicine.  2.  Contact Dr. Niger Reid [phone]) to help with coordination of care. [facility] [phone]) regarding  PET scan. 4.  Obtain pulmonary medicine consult. 5.  Reschedule PET scan:  2.3 cm left lower lobe pulmonary nodule. 6.  RTC for MD assessment after PET scan.   Lequita Asal, MD  08/14/2015, 12:09 PM

## 2015-08-15 ENCOUNTER — Encounter: Payer: Self-pay | Admitting: Hematology and Oncology

## 2015-08-18 ENCOUNTER — Emergency Department: Payer: Medicare Other

## 2015-08-18 ENCOUNTER — Inpatient Hospital Stay
Admission: EM | Admit: 2015-08-18 | Discharge: 2015-08-23 | DRG: 189 | Disposition: A | Discharge status: Hospice | Payer: Medicare Other | Attending: Internal Medicine | Admitting: Internal Medicine

## 2015-08-18 DIAGNOSIS — Z7901 Long term (current) use of anticoagulants: Secondary | ICD-10-CM

## 2015-08-18 DIAGNOSIS — J441 Chronic obstructive pulmonary disease with (acute) exacerbation: Secondary | ICD-10-CM | POA: Diagnosis present

## 2015-08-18 DIAGNOSIS — Z66 Do not resuscitate: Secondary | ICD-10-CM | POA: Diagnosis present

## 2015-08-18 DIAGNOSIS — R4182 Altered mental status, unspecified: Secondary | ICD-10-CM

## 2015-08-18 DIAGNOSIS — Z79899 Other long term (current) drug therapy: Secondary | ICD-10-CM

## 2015-08-18 DIAGNOSIS — J9601 Acute respiratory failure with hypoxia: Secondary | ICD-10-CM | POA: Diagnosis present

## 2015-08-18 DIAGNOSIS — Z888 Allergy status to other drugs, medicaments and biological substances status: Secondary | ICD-10-CM

## 2015-08-18 DIAGNOSIS — I2510 Atherosclerotic heart disease of native coronary artery without angina pectoris: Secondary | ICD-10-CM | POA: Diagnosis present

## 2015-08-18 DIAGNOSIS — Z8673 Personal history of transient ischemic attack (TIA), and cerebral infarction without residual deficits: Secondary | ICD-10-CM

## 2015-08-18 DIAGNOSIS — I110 Hypertensive heart disease with heart failure: Secondary | ICD-10-CM | POA: Diagnosis present

## 2015-08-18 DIAGNOSIS — G9341 Metabolic encephalopathy: Secondary | ICD-10-CM | POA: Diagnosis present

## 2015-08-18 DIAGNOSIS — I5030 Unspecified diastolic (congestive) heart failure: Secondary | ICD-10-CM | POA: Diagnosis present

## 2015-08-18 DIAGNOSIS — J9621 Acute and chronic respiratory failure with hypoxia: Secondary | ICD-10-CM | POA: Diagnosis not present

## 2015-08-18 DIAGNOSIS — C3490 Malignant neoplasm of unspecified part of unspecified bronchus or lung: Secondary | ICD-10-CM | POA: Diagnosis present

## 2015-08-18 DIAGNOSIS — I739 Peripheral vascular disease, unspecified: Secondary | ICD-10-CM | POA: Diagnosis present

## 2015-08-18 DIAGNOSIS — E785 Hyperlipidemia, unspecified: Secondary | ICD-10-CM | POA: Diagnosis present

## 2015-08-18 DIAGNOSIS — E860 Dehydration: Secondary | ICD-10-CM | POA: Diagnosis present

## 2015-08-18 DIAGNOSIS — Z87891 Personal history of nicotine dependence: Secondary | ICD-10-CM

## 2015-08-18 DIAGNOSIS — Z8249 Family history of ischemic heart disease and other diseases of the circulatory system: Secondary | ICD-10-CM

## 2015-08-18 DIAGNOSIS — Z515 Encounter for palliative care: Secondary | ICD-10-CM | POA: Diagnosis present

## 2015-08-18 DIAGNOSIS — M1990 Unspecified osteoarthritis, unspecified site: Secondary | ICD-10-CM | POA: Diagnosis present

## 2015-08-18 DIAGNOSIS — Z950 Presence of cardiac pacemaker: Secondary | ICD-10-CM

## 2015-08-18 DIAGNOSIS — Z951 Presence of aortocoronary bypass graft: Secondary | ICD-10-CM

## 2015-08-18 LAB — GLUCOSE, CAPILLARY: Glucose-Capillary: 123 mg/dL — ABNORMAL HIGH (ref 65–99)

## 2015-08-18 LAB — COMPREHENSIVE METABOLIC PANEL
ALBUMIN: 4.1 g/dL (ref 3.5–5.0)
ALT: 40 U/L (ref 17–63)
AST: 35 U/L (ref 15–41)
Alkaline Phosphatase: 80 U/L (ref 38–126)
Anion gap: 4 — ABNORMAL LOW (ref 5–15)
BUN: 39 mg/dL — AB (ref 6–20)
CHLORIDE: 106 mmol/L (ref 101–111)
CO2: 30 mmol/L (ref 22–32)
CREATININE: 1.49 mg/dL — AB (ref 0.61–1.24)
Calcium: 9.9 mg/dL (ref 8.9–10.3)
GFR calc Af Amer: 47 mL/min — ABNORMAL LOW (ref >60)
GFR, EST NON AFRICAN AMERICAN: 41 mL/min — AB (ref >60)
GLUCOSE: 135 mg/dL — AB (ref 65–99)
Potassium: 3.9 mmol/L (ref 3.5–5.1)
Sodium: 140 mmol/L (ref 135–145)
Total Bilirubin: 0.5 mg/dL (ref 0.3–1.2)
Total Protein: 6.5 g/dL (ref 6.5–8.1)

## 2015-08-18 LAB — CBC
HCT: 38.7 % — ABNORMAL LOW (ref 40.0–52.0)
Hemoglobin: 12.9 g/dL — ABNORMAL LOW (ref 13.0–18.0)
MCH: 32.9 pg (ref 26.0–34.0)
MCHC: 33.3 g/dL (ref 32.0–36.0)
MCV: 99 fL (ref 80.0–100.0)
PLATELETS: 230 10*3/uL (ref 150–440)
RBC: 3.91 MIL/uL — ABNORMAL LOW (ref 4.40–5.90)
RDW: 13.9 % (ref 11.5–14.5)
WBC: 8.4 10*3/uL (ref 3.8–10.6)

## 2015-08-18 LAB — PROTIME-INR
INR: 3.97
Prothrombin Time: 37.8 seconds — ABNORMAL HIGH (ref 11.4–15.0)

## 2015-08-18 MED ORDER — SODIUM CHLORIDE 0.9 % IV BOLUS (SEPSIS)
1000.0000 mL | Freq: Once | INTRAVENOUS | Status: AC
  Administered 2015-08-18: 1000 mL via INTRAVENOUS

## 2015-08-18 NOTE — ED Notes (Signed)
Pt presents to ED via [place] from group home. Pt was said to be slow to respond when spoken to, which was different from his norm per staff. Pt eyes closed and mouth open with even unlabored respirations noted upon arrival. Answers questions appropriately when asked but otherwise keeps eyes closed and appears non-verbal. Breathing tx given prior to EMS arrival.

## 2015-08-18 NOTE — Discharge Instructions (Signed)
Dehydration Dehydration is when you lose more fluids from the body than you take in. Vital organs such as the kidneys, brain, and heart cannot function without a proper amount of fluids and salt. Any loss of fluids from the body can cause dehydration.  Older adults are at a higher risk of dehydration than younger adults. As we age, our bodies are less able to conserve water and do not respond to temperature changes as well. Also, older adults do not become thirsty as easily or quickly. Because of this, older adults often do not realize they need to increase fluids to avoid dehydration.  CAUSES   Vomiting.  Diarrhea.  Excessive sweating.  Excessive urination.  Fever.  Certain medicines, such as blood pressure medicines called diuretics.  Poorly controlled blood sugars. SIGNS AND SYMPTOMS  Mild dehydration:  Thirst.  Dry lips.  Slightly dry mouth. Moderate dehydration:  Very dry mouth.  Sunken eyes.  Skin does not bounce back quickly when lightly pinched and released.  Dark urine and decreased urine production.  Decreased tear production.  Headache. Severe dehydration:  Very dry mouth.  Extreme thirst.  Rapid, weak pulse (more than 100 beats per minute at rest).  Cold hands and feet.  Not able to sweat in spite of heat.  Rapid breathing.  Blue lips.  Confusion and lethargy.  Difficulty being awakened.  Minimal urine production.  No tears. DIAGNOSIS  Your health care provider will diagnose dehydration based on your symptoms and your exam. Blood and urine tests will help confirm the diagnosis. The diagnostic evaluation should also identify the cause of dehydration. TREATMENT  Treatment of mild or moderate dehydration can often be done at home by increasing the amount of fluids that you drink. It is best to drink small amounts of fluid more often. Drinking too much at one time can make vomiting worse. Severe dehydration needs to be treated at the hospital.  You may be given IV fluids that contain water and electrolytes. HOME CARE INSTRUCTIONS   Ask your health care provider about specific rehydration instructions.  Drink enough fluids to keep your urine clear or pale yellow.  Drink small amounts frequently if you have nausea and vomiting.  Eat as you normally do.  Avoid:  Foods or drinks high in sugar.  Carbonated drinks.  Juice.  Extremely hot or cold fluids.  Drinks with caffeine.  Fatty, greasy foods.  Alcohol.  Tobacco.  Overeating.  Gelatin desserts.  Wash your hands well to avoid spreading bacteria and viruses.  Only take over-the-counter or prescription medicines for pain, discomfort, or fever as directed by your health care provider.  Ask your health care provider if you should continue all prescribed and over-the-counter medicines.  Keep all follow-up appointments with your health care provider. SEEK MEDICAL CARE IF:  You have abdominal pain, and it increases or stays in one area (localizes).  You have a rash, stiff neck, or severe headache.  You are irritable, sleepy, or difficult to awaken.  You are weak, dizzy, or extremely thirsty.  You have a fever. SEEK IMMEDIATE MEDICAL CARE IF:   You are unable to keep fluids down, or you get worse despite treatment.  You have frequent episodes of vomiting or diarrhea.  You have blood or green matter (bile) in your vomit.  You have blood in your stool, or your stool looks black and tarry.  You have not urinated in 6-8 hours, or you have only urinated a small amount of very dark urine.  You faint. MAKE SURE YOU:   Understand these instructions.  Will watch your condition.  Will get help right away if you are not doing well or get worse.   This information is not intended to replace advice given to you by your health care provider. Make sure you discuss any questions you have with your health care provider.   Document Released: 07/13/2003 Document  Revised: 04/27/2013 Document Reviewed: 12/28/2012 Elsevier Interactive Patient Education 2016 [employer].  Rehydration, Adult Rehydration is the replacement of body fluids lost during dehydration. Dehydration is an extreme loss of body fluids to the point of body function impairment. There are many ways extreme fluid loss can occur, including vomiting, diarrhea, or excess sweating. Recovering from dehydration requires replacing lost fluids, continuing to eat to maintain strength, and avoiding foods and beverages that may contribute to further fluid loss or may increase nausea. HOW TO REHYDRATE In most cases, rehydration involves the replacement of not only fluids but also carbohydrates and basic body salts. Rehydration with an oral rehydration solution is one way to replace essential nutrients lost through dehydration. An oral rehydration solution can be purchased at pharmacies, retail stores, and online. Premixed packets of powder that you combine with water to make a solution are also sold. You can prepare an oral rehydration solution at home by mixing the following ingredients together:    - tsp table salt.   tsp baking soda.   tsp salt substitute containing potassium chloride.  1 tablespoons sugar.  1 L (34 oz) of water. Be sure to use exact measurements. Including too much sugar can make diarrhea worse. Drink -1 cup (120-240 mL) of oral rehydration solution each time you have diarrhea or vomit. If drinking this amount makes your vomiting worse, try drinking smaller amounts more often. For example, drink 1-3 tsp every 5-10 minutes.  A general rule for staying hydrated is to drink 1-2 L of fluid per day. Talk to your caregiver about the specific amount you should be drinking each day. Drink enough fluids to keep your urine clear or pale yellow. EATING WHEN DEHYDRATED Even if you have had severe sweating or you are having diarrhea, do not stop eating. Many healthy items in a normal diet  are okay to continue eating while recovering from dehydration. The following tips can help you to lessen nausea when you eat:  Ask someone else to prepare your food. Cooking smells may worsen nausea.  Eat in a well-ventilated room away from cooking smells.  Sit up when you eat. Avoid lying down until 1-2 hours after eating.  Eat small amounts when you eat.  Eat foods that are easy to digest. These include soft, well-cooked, or mashed foods. FOODS AND BEVERAGES TO AVOID Avoid eating or drinking the following foods and beverages that may increase nausea or further loss of fluid:   Fruit juices with a high sugar content, such as concentrated juices.  Alcohol.  Beverages containing caffeine.  Carbonated drinks. They may cause a lot of gas.  Foods that may cause a lot of gas, such as cabbage, broccoli, and beans.  Fatty, greasy, and fried foods.  Spicy, very salty, and very sweet foods or drinks.  Foods or drinks that are very hot or very cold. Consume food or drinks at or near room temperature.  Foods that need a lot of chewing, such as raw vegetables.  Foods that are sticky or hard to swallow, such as peanut butter.   This information is not intended to  replace advice given to you by your health care provider. Make sure you discuss any questions you have with your health care provider.   Document Released: 07/15/2011 Document Revised: 01/15/2012 Document Reviewed: 07/15/2011 Elsevier Interactive Patient Education [employer].

## 2015-08-18 NOTE — ED Provider Notes (Signed)
[facility] Emergency Department Provider Note  ____________________________________________  Time seen: 9:40 PM  I have reviewed the triage vital signs and the nursing notes.   HISTORY  Chief Complaint Altered Mental Status    HPI Daniel Huff is a 82 y.o. male sent to the ED from his group home due to slow responsiveness. They felt that this was abnormal. The patient denies any symptoms and states that he feels fine. He does report that he has not been eating or drinking much over the last few days because he didn't feel like it. Denies any other complaints.     Past Medical History  Diagnosis Date  . PVD (peripheral vascular disease) ([place])   . Hypertension   . DJD (degenerative joint disease)   . Ventricular tachycardia (paroxysmal) ([place])   . BPH (benign prostatic hyperplasia)   . Cardiomyopathy [facility])      Patient Active Problem List   Diagnosis Date Noted  . Protein-calorie malnutrition, severe 06/02/2015  . Lung mass 05/31/2015  . Multiple falls 05/31/2015  . Multiple rib fractures 05/31/2015  . Compression fracture of T12 vertebra ([place]) 05/31/2015  . Compression fracture of L4 lumbar vertebra ([place]) 05/31/2015  . HTN (hypertension) 05/31/2015  . Aortic valve replaced 05/31/2015  . CAD (coronary artery disease) 05/31/2015  . BPH (benign prostatic hyperplasia) 05/31/2015     Past Surgical History  Procedure Laterality Date  . Pacemaker placement    . Cardiac surgery       Current Outpatient Rx  Name  Route  Sig  Dispense  Refill  . acetaminophen (TYLENOL) 325 MG tablet   Oral   Take 2 tablets (650 mg total) by mouth every 6 (six) hours as needed.   30 tablet   2   . albuterol (PROVENTIL HFA;VENTOLIN HFA) 108 (90 Base) MCG/ACT inhaler   Inhalation   Inhale 2 puffs into the lungs every 6 (six) hours as needed for wheezing or shortness of breath.   1 Inhaler   2   . allopurinol (ZYLOPRIM) 100 MG tablet   Oral  Take 100 mg by mouth daily.         . Calcium Carbonate-Vitamin D (CALCIUM 600+D) 600-400 MG-UNIT per tablet   Oral   Take 1 tablet by mouth 2 (two) times daily with a meal.         . finasteride (PROSCAR) 5 MG tablet   Oral   Take 5 mg by mouth daily.         . magnesium oxide (MAG-OX) 400 MG tablet   Oral   Take 400 mg by mouth daily.         . Melatonin 3 MG TABS   Oral   Take 3 mg by mouth at bedtime.         . metoprolol succinate (TOPROL-XL) 25 MG 24 hr tablet   Oral   Take 12.5 mg by mouth daily.         Marland Kitchen oxyCODONE (OXY IR/ROXICODONE) 5 MG immediate release tablet   Oral   Take 0.5 tablets (2.5 mg total) by mouth every 6 (six) hours as needed for moderate pain or severe pain.   20 tablet   0   . senna-docusate (SENOKOT-S) 8.6-50 MG tablet   Oral   Take 1 tablet by mouth daily.         . simvastatin (ZOCOR) 5 MG tablet   Oral   Take 5 mg by mouth at bedtime.          Marland Kitchen  triamcinolone (KENALOG) 0.025 % cream   Topical   Apply 1 application topically 2 (two) times daily as needed (for rash on back).         . warfarin (COUMADIN) 1 MG tablet   Oral   Take 2 mg by mouth daily.            Allergies Aspirin   Family History  Problem Relation Age of Onset  . Heart failure Mother   . Heart failure Father     Social History Social History  Substance Use Topics  . Smoking status: Former Smoker    Quit date: 05/06/2012  . Smokeless tobacco: Not on file  . Alcohol Use: Not on file    Review of Systems  Constitutional:   No fever or chills.  Eyes:   No vision changes.  ENT:   No sore throat. No rhinorrhea. Cardiovascular:   No chest pain. Respiratory:   No dyspnea or cough. Gastrointestinal:   Negative for abdominal pain, vomiting and diarrhea.  No bloody stool. Genitourinary:   Negative for dysuria or difficulty urinating. Musculoskeletal:   Negative for focal pain or swelling Neurological:   Negative for headaches 10-point  ROS otherwise negative.  ____________________________________________   PHYSICAL EXAM:  VITAL SIGNS: ED Triage Vitals  Enc Vitals Group     BP 08/18/15 2000 154/100 mmHg     Pulse Rate 08/18/15 2000 83     Resp 08/18/15 2000 34     Temp 08/18/15 2001 98 F (36.7 C)     Temp Source 08/18/15 2001 Oral     SpO2 08/18/15 1954 94 %     Weight 08/18/15 2001 140 lb (63.504 kg)     Height 08/18/15 2001 '5\' 9"'$  (1.753 m)     Head Cir --      Peak Flow --      Pain Score 08/18/15 2003 0     Pain Loc --      Pain Edu? --      Excl. in [place]? --     Vital signs reviewed, nursing assessments reviewed.   Constitutional:   Alert and oriented. Well appearing and in no distress. Eyes:   No scleral icterus. No conjunctival pallor. PERRL. EOMI ENT   Head:   Normocephalic and atraumatic.   Nose:   No congestion/rhinnorhea. No septal hematoma   Mouth/Throat:   Dry mucous membranes, no pharyngeal erythema. No peritonsillar mass.    Neck:   No stridor. No SubQ emphysema. No meningismus. Hematological/Lymphatic/Immunilogical:   No cervical lymphadenopathy. Cardiovascular:   RRR. Symmetric bilateral radial and DP pulses.  No murmurs.  Respiratory:   Normal respiratory effort without tachypnea nor retractions. Breath sounds are clear and equal bilaterally. No wheezes/rales/rhonchi. Gastrointestinal:   Soft with right upper quadrant tenderness. Non distended. There is no CVA tenderness.  No rebound, rigidity, or guarding. Genitourinary:   deferred Musculoskeletal:   Nontender with normal range of motion in all extremities. No joint effusions.  No lower extremity tenderness.  No edema. Neurologic:   Normal speech and language.  CN 2-10 normal. Motor grossly intact. No gross focal neurologic deficits are appreciated.  Skin:    Skin is warm, dry and intact. No rash noted.  No petechiae, purpura, or bullae. Poor skin turgor  ____________________________________________    LABS (pertinent  positives/negatives) (all labs ordered are listed, but only abnormal results are displayed) Labs Reviewed  COMPREHENSIVE METABOLIC PANEL - Abnormal; Notable for the following:    Glucose, Bld 135 (*)  BUN 39 (*)    Creatinine, Ser 1.49 (*)    GFR calc non Af Amer 41 (*)    GFR calc Af Amer 47 (*)    Anion gap 4 (*)    All other components within normal limits  CBC - Abnormal; Notable for the following:    RBC 3.91 (*)    Hemoglobin 12.9 (*)    HCT 38.7 (*)    All other components within normal limits  GLUCOSE, CAPILLARY - Abnormal; Notable for the following:    Glucose-Capillary 123 (*)    All other components within normal limits  PROTIME-INR - Abnormal; Notable for the following:    Prothrombin Time 37.8 (*)    All other components within normal limits  URINALYSIS COMPLETEWITH MICROSCOPIC ([facility] ONLY)  CBG MONITORING, ED   ____________________________________________   EKG  Interpreted by me 20 paced rhythm. Right axis. Normal intervals. Left bundle branch block. No acute ischemic changes.  ____________________________________________    RADIOLOGY  Ultrasound right upper quadrant pending  ____________________________________________   PROCEDURES   ____________________________________________   INITIAL IMPRESSION / ASSESSMENT AND PLAN / ED COURSE  Pertinent labs & imaging results that were available during my care of the patient were reviewed by me and considered in my medical decision making (see chart for details).  Patient well appearing no acute distress. Responds appropriately to questioning coherently. Calm and comfortable, vital signs unremarkable. Labs unremarkable. Ultrasound pending to evaluate for cholecystitis although I have low suspicion. Care of the patient was signed out to Dr. Archie Balboa to follow up on ultrasound, plan is to discharge home if the patient does not have frank cholecystitis. Labs do show that he has chronic renal insufficiency  which is at baseline according to previous results. Patient is given IV fluids in the emergency department for dehydration which appears to be mild but is clinically apparent on exam.     ____________________________________________   FINAL CLINICAL IMPRESSION(S) / ED DIAGNOSES  Final diagnoses:  Mild dehydration       Portions of this note were generated with dragon dictation software. Dictation errors may occur despite best attempts at proofreading.   Carrie Mew, MD 08/18/15 [phone]

## 2015-08-19 ENCOUNTER — Emergency Department: Payer: Medicare Other

## 2015-08-19 ENCOUNTER — Encounter: Payer: Self-pay | Admitting: Internal Medicine

## 2015-08-19 DIAGNOSIS — J9601 Acute respiratory failure with hypoxia: Secondary | ICD-10-CM | POA: Diagnosis not present

## 2015-08-19 DIAGNOSIS — Z8249 Family history of ischemic heart disease and other diseases of the circulatory system: Secondary | ICD-10-CM | POA: Diagnosis not present

## 2015-08-19 DIAGNOSIS — E860 Dehydration: Secondary | ICD-10-CM | POA: Diagnosis present

## 2015-08-19 DIAGNOSIS — J9621 Acute and chronic respiratory failure with hypoxia: Secondary | ICD-10-CM | POA: Diagnosis present

## 2015-08-19 DIAGNOSIS — G9341 Metabolic encephalopathy: Secondary | ICD-10-CM

## 2015-08-19 DIAGNOSIS — E785 Hyperlipidemia, unspecified: Secondary | ICD-10-CM | POA: Diagnosis present

## 2015-08-19 DIAGNOSIS — Z79899 Other long term (current) drug therapy: Secondary | ICD-10-CM | POA: Diagnosis not present

## 2015-08-19 DIAGNOSIS — Z87891 Personal history of nicotine dependence: Secondary | ICD-10-CM | POA: Diagnosis not present

## 2015-08-19 DIAGNOSIS — Z950 Presence of cardiac pacemaker: Secondary | ICD-10-CM | POA: Diagnosis not present

## 2015-08-19 DIAGNOSIS — C3492 Malignant neoplasm of unspecified part of left bronchus or lung: Secondary | ICD-10-CM | POA: Diagnosis not present

## 2015-08-19 DIAGNOSIS — Z951 Presence of aortocoronary bypass graft: Secondary | ICD-10-CM | POA: Diagnosis not present

## 2015-08-19 DIAGNOSIS — Z515 Encounter for palliative care: Secondary | ICD-10-CM | POA: Diagnosis present

## 2015-08-19 DIAGNOSIS — I5030 Unspecified diastolic (congestive) heart failure: Secondary | ICD-10-CM | POA: Diagnosis present

## 2015-08-19 DIAGNOSIS — Z888 Allergy status to other drugs, medicaments and biological substances status: Secondary | ICD-10-CM | POA: Diagnosis not present

## 2015-08-19 DIAGNOSIS — Z7901 Long term (current) use of anticoagulants: Secondary | ICD-10-CM | POA: Diagnosis not present

## 2015-08-19 DIAGNOSIS — I110 Hypertensive heart disease with heart failure: Secondary | ICD-10-CM | POA: Diagnosis present

## 2015-08-19 DIAGNOSIS — I739 Peripheral vascular disease, unspecified: Secondary | ICD-10-CM | POA: Diagnosis present

## 2015-08-19 DIAGNOSIS — Z66 Do not resuscitate: Secondary | ICD-10-CM | POA: Diagnosis present

## 2015-08-19 DIAGNOSIS — C3490 Malignant neoplasm of unspecified part of unspecified bronchus or lung: Secondary | ICD-10-CM | POA: Diagnosis present

## 2015-08-19 DIAGNOSIS — J441 Chronic obstructive pulmonary disease with (acute) exacerbation: Secondary | ICD-10-CM | POA: Diagnosis present

## 2015-08-19 DIAGNOSIS — Z8673 Personal history of transient ischemic attack (TIA), and cerebral infarction without residual deficits: Secondary | ICD-10-CM | POA: Diagnosis not present

## 2015-08-19 DIAGNOSIS — I2510 Atherosclerotic heart disease of native coronary artery without angina pectoris: Secondary | ICD-10-CM | POA: Diagnosis present

## 2015-08-19 DIAGNOSIS — M1990 Unspecified osteoarthritis, unspecified site: Secondary | ICD-10-CM | POA: Diagnosis present

## 2015-08-19 LAB — BLOOD GAS, ARTERIAL
ACID-BASE EXCESS: 3.3 mmol/L — AB (ref 0.0–3.0)
Bicarbonate: 28.5 mEq/L — ABNORMAL HIGH (ref 21.0–28.0)
FIO2: 0.24
O2 SAT: 94.8 %
PATIENT TEMPERATURE: 37
PCO2 ART: 45 mmHg (ref 32.0–48.0)
PO2 ART: 74 mmHg — AB (ref 83.0–108.0)
pH, Arterial: 7.41 (ref 7.350–7.450)

## 2015-08-19 LAB — CREATININE, SERUM
Creatinine, Ser: 1.58 mg/dL — ABNORMAL HIGH (ref 0.61–1.24)
GFR calc Af Amer: 44 mL/min — ABNORMAL LOW (ref >60)
GFR, EST NON AFRICAN AMERICAN: 38 mL/min — AB (ref >60)

## 2015-08-19 LAB — URINALYSIS COMPLETE WITH MICROSCOPIC (ARMC ONLY)
Bilirubin Urine: NEGATIVE
Glucose, UA: NEGATIVE mg/dL
Hgb urine dipstick: NEGATIVE
Ketones, ur: NEGATIVE mg/dL
Leukocytes, UA: NEGATIVE
Nitrite: NEGATIVE
PH: 5 (ref 5.0–8.0)
PROTEIN: 100 mg/dL — AB
Specific Gravity, Urine: 1.021 (ref 1.005–1.030)

## 2015-08-19 LAB — PROTIME-INR
INR: 4.17
PROTHROMBIN TIME: 39.2 s — AB (ref 11.4–15.0)

## 2015-08-19 LAB — CBC
HEMATOCRIT: 40.9 % (ref 40.0–52.0)
HEMOGLOBIN: 13.6 g/dL (ref 13.0–18.0)
MCH: 32.9 pg (ref 26.0–34.0)
MCHC: 33.4 g/dL (ref 32.0–36.0)
MCV: 98.5 fL (ref 80.0–100.0)
Platelets: 252 10*3/uL (ref 150–440)
RBC: 4.15 MIL/uL — ABNORMAL LOW (ref 4.40–5.90)
RDW: 14.1 % (ref 11.5–14.5)
WBC: 11 10*3/uL — ABNORMAL HIGH (ref 3.8–10.6)

## 2015-08-19 LAB — AMMONIA: Ammonia: 9 umol/L (ref 9–35)

## 2015-08-19 MED ORDER — ACETAMINOPHEN 325 MG PO TABS: 650.0000 mg | ORAL_TABLET | Freq: Four times a day (QID) | ORAL | Status: DC | PRN

## 2015-08-19 MED ORDER — ONDANSETRON HCL 4 MG/2ML IJ SOLN: 4.0000 mg | Freq: Four times a day (QID) | INTRAMUSCULAR | Status: DC | PRN

## 2015-08-19 MED ORDER — WARFARIN SODIUM 2 MG PO TABS: 2.0000 mg | ORAL_TABLET | Freq: Every day | ORAL | Status: DC

## 2015-08-19 MED ORDER — ACETAMINOPHEN 650 MG RE SUPP: 650.0000 mg | Freq: Four times a day (QID) | RECTAL | Status: DC | PRN

## 2015-08-19 MED ORDER — FINASTERIDE 5 MG PO TABS
5.0000 mg | ORAL_TABLET | Freq: Every day | ORAL | Status: DC
  Filled 2015-08-19 (×2): qty 1

## 2015-08-19 MED ORDER — HALOPERIDOL LACTATE 5 MG/ML IJ SOLN: 0.5000 mg | INTRAMUSCULAR | Status: DC | PRN

## 2015-08-19 MED ORDER — ONDANSETRON 4 MG PO TBDP: 4.0000 mg | ORAL_TABLET | Freq: Four times a day (QID) | ORAL | Status: DC | PRN

## 2015-08-19 MED ORDER — FUROSEMIDE 10 MG/ML IJ SOLN
40.0000 mg | Freq: Once | INTRAMUSCULAR | Status: AC
  Administered 2015-08-19: 40 mg via INTRAVENOUS
  Filled 2015-08-19: qty 4

## 2015-08-19 MED ORDER — MORPHINE SULFATE (PF) 2 MG/ML IV SOLN: 2.0000 mg | INTRAVENOUS | Status: DC | PRN

## 2015-08-19 MED ORDER — IPRATROPIUM-ALBUTEROL 0.5-2.5 (3) MG/3ML IN SOLN: 3.0000 mL | RESPIRATORY_TRACT | Status: DC | PRN

## 2015-08-19 MED ORDER — HALOPERIDOL LACTATE 5 MG/ML IJ SOLN
5.0000 mg | Freq: Once | INTRAMUSCULAR | Status: AC
  Administered 2015-08-19: 5 mg via INTRAVENOUS

## 2015-08-19 MED ORDER — HALOPERIDOL 0.5 MG PO TABS: 0.5000 mg | ORAL_TABLET | ORAL | Status: DC | PRN

## 2015-08-19 MED ORDER — OXYCODONE HCL 5 MG PO TABS: 5.0000 mg | ORAL_TABLET | ORAL | Status: DC | PRN

## 2015-08-19 MED ORDER — SODIUM CHLORIDE 0.9% FLUSH: 3.0000 mL | Freq: Two times a day (BID) | INTRAVENOUS | Status: DC

## 2015-08-19 MED ORDER — MORPHINE SULFATE (CONCENTRATE) 10 MG/0.5ML PO SOLN
5.0000 mg | ORAL | Status: DC | PRN
  Administered 2015-08-23: 5 mg via SUBLINGUAL
  Filled 2015-08-19: qty 0.5

## 2015-08-19 MED ORDER — LORAZEPAM 2 MG/ML PO CONC: 1.0000 mg | ORAL | Status: DC | PRN

## 2015-08-19 MED ORDER — SIMVASTATIN 10 MG PO TABS: 5.0000 mg | ORAL_TABLET | Freq: Every day | ORAL | Status: DC

## 2015-08-19 MED ORDER — MAGNESIUM OXIDE 400 (241.3 MG) MG PO TABS: 400.0000 mg | ORAL_TABLET | Freq: Every day | ORAL | Status: DC

## 2015-08-19 MED ORDER — MORPHINE SULFATE (PF) 2 MG/ML IV SOLN
1.0000 mg | INTRAVENOUS | Status: DC | PRN
Start: 2015-08-19 — End: 2015-08-23
  Administered 2015-08-20 – 2015-08-22 (×5): 1 mg via INTRAVENOUS
  Filled 2015-08-19 (×5): qty 1

## 2015-08-19 MED ORDER — HALOPERIDOL LACTATE 5 MG/ML IJ SOLN
INTRAMUSCULAR | Status: AC
  Filled 2015-08-19: qty 1

## 2015-08-19 MED ORDER — MORPHINE SULFATE (CONCENTRATE) 10 MG/0.5ML PO SOLN: 5.0000 mg | ORAL | Status: DC | PRN

## 2015-08-19 MED ORDER — CALCIUM CARBONATE-VITAMIN D 500-200 MG-UNIT PO TABS: 1.0000 | ORAL_TABLET | Freq: Two times a day (BID) | ORAL | Status: DC

## 2015-08-19 MED ORDER — ONDANSETRON HCL 4 MG PO TABS: 4.0000 mg | ORAL_TABLET | Freq: Four times a day (QID) | ORAL | Status: DC | PRN

## 2015-08-19 MED ORDER — LORAZEPAM 1 MG PO TABS: 1.0000 mg | ORAL_TABLET | ORAL | Status: DC | PRN

## 2015-08-19 MED ORDER — HALOPERIDOL LACTATE 2 MG/ML PO CONC
0.5000 mg | ORAL | Status: DC | PRN
  Filled 2015-08-19: qty 0.3

## 2015-08-19 MED ORDER — SENNOSIDES-DOCUSATE SODIUM 8.6-50 MG PO TABS
1.0000 | ORAL_TABLET | Freq: Every day | ORAL | Status: DC
  Filled 2015-08-19 (×2): qty 1

## 2015-08-19 MED ORDER — HEPARIN SODIUM (PORCINE) 5000 UNIT/ML IJ SOLN: 5000.0000 [IU] | Freq: Three times a day (TID) | INTRAMUSCULAR | Status: DC

## 2015-08-19 MED ORDER — LORAZEPAM 2 MG/ML IJ SOLN
1.0000 mg | INTRAMUSCULAR | Status: DC | PRN
  Administered 2015-08-19 – 2015-08-22 (×4): 1 mg via INTRAVENOUS
  Filled 2015-08-19 (×4): qty 1

## 2015-08-19 MED ORDER — ALLOPURINOL 100 MG PO TABS
100.0000 mg | ORAL_TABLET | Freq: Every day | ORAL | Status: DC
  Filled 2015-08-19 (×2): qty 1

## 2015-08-19 MED ORDER — IPRATROPIUM-ALBUTEROL 0.5-2.5 (3) MG/3ML IN SOLN: 3.0000 mL | Freq: Four times a day (QID) | RESPIRATORY_TRACT | Status: DC

## 2015-08-19 MED ORDER — METOPROLOL SUCCINATE ER 25 MG PO TB24: 12.5000 mg | ORAL_TABLET | Freq: Every day | ORAL | Status: DC

## 2015-08-19 NOTE — ED Notes (Signed)
Pt given medication at this time. Pt tolerated well. Farley Ly., RN at bedside at this time. MD at bedside at this time.

## 2015-08-19 NOTE — Progress Notes (Signed)
MD notified of INR 4.17, [place], MD ordered to hold coumadin and order protime for mon am

## 2015-08-19 NOTE — NC FL2 (Signed)
[place] LEVEL OF CARE SCREENING TOOL     IDENTIFICATION  Patient Name: Daniel Huff Birthdate: Jun 21, 1926 Sex: male Admission Date (Current Location): 08/18/2015  [facility] and [place] Number:  Engineering geologist and Address:  [facility], [address], [place], [place] [address]      Provider Number: [id number]  Attending Physician Name and Address:  Lytle Butte, MD  Relative Name and Phone Number:       Current Level of Care: Domiciliary (Rest home) Recommended Level of Care: [facility] Prior Approval Number:    Date Approved/Denied:   PASRR Number:    Discharge Plan: SNF    Current Diagnoses: Patient Active Problem List   Diagnosis Date Noted  . Metabolic encephalopathy 28/41/3244  . Acute respiratory failure with hypoxia ([place]) 08/19/2015  . Protein-calorie malnutrition, severe 06/02/2015  . Lung mass 05/31/2015  . Multiple falls 05/31/2015  . Multiple rib fractures 05/31/2015  . Compression fracture of T12 vertebra ([place]) 05/31/2015  . Compression fracture of L4 lumbar vertebra ([place]) 05/31/2015  . HTN (hypertension) 05/31/2015  . Aortic valve replaced 05/31/2015  . CAD (coronary artery disease) 05/31/2015  . BPH (benign prostatic hyperplasia) 05/31/2015    Orientation RESPIRATION BLADDER Height & Weight     Self  O2 Continent Weight: 140 lb (63.504 kg) Height:  '5\' 9"'$  (175.3 cm)  BEHAVIORAL SYMPTOMS/MOOD NEUROLOGICAL BOWEL NUTRITION STATUS      Continent Diet (Normal)  AMBULATORY STATUS COMMUNICATION OF NEEDS Skin   Extensive Assist Verbally Normal                       Personal Care Assistance Level of Assistance  Bathing, Feeding, Dressing, Total care Bathing Assistance: Maximum assistance Feeding assistance: Maximum assistance Dressing Assistance: Maximum assistance Total Care Assistance: Maximum assistance   Functional Limitations Info  Hearing, Speech Sight Info: Impaired Hearing  Info: Impaired Speech Info: Adequate    SPECIAL CARE FACTORS FREQUENCY                       Contractures Contractures Info: Not present    Additional Factors Info  Code Status Code Status Info: Full             Current Medications (08/19/2015):  This is the current hospital active medication list Current Facility-Administered Medications  Medication Dose Route Frequency Provider Last Rate Last Dose  . acetaminophen (TYLENOL) tablet 650 mg  650 mg Oral Q6H PRN Lytle Butte, MD       Or  . acetaminophen (TYLENOL) suppository 650 mg  650 mg Rectal Q6H PRN Lytle Butte, MD      . ipratropium-albuterol (DUONEB) 0.5-2.5 (3) MG/3ML nebulizer solution 3 mL  3 mL Nebulization Q4H PRN Lytle Butte, MD      . ondansetron [facility]) tablet 4 mg  4 mg Oral Q6H PRN Lytle Butte, MD       Or  . ondansetron [facility]) injection 4 mg  4 mg Intravenous Q6H PRN Lytle Butte, MD      . oxyCODONE (Oxy IR/ROXICODONE) immediate release tablet 5 mg  5 mg Oral Q4H PRN Lytle Butte, MD      . sodium chloride flush (NS) 0.9 % injection 3 mL  3 mL Intravenous Q12H Lytle Butte, MD       Current Outpatient Prescriptions  Medication Sig Dispense Refill  . acetaminophen (TYLENOL) 325 MG tablet Take  2 tablets (650 mg total) by mouth every 6 (six) hours as needed. (Patient taking differently: Take 650 mg by mouth every 6 (six) hours. ) 30 tablet 2  . albuterol (PROVENTIL HFA;VENTOLIN HFA) 108 (90 Base) MCG/ACT inhaler Inhale 2 puffs into the lungs every 6 (six) hours as needed for wheezing or shortness of breath. 1 Inhaler 2  . allopurinol (ZYLOPRIM) 100 MG tablet Take 100 mg by mouth daily.    . Calcium Carbonate-Vitamin D (CALCIUM 600+D) 600-400 MG-UNIT per tablet Take 1 tablet by mouth 2 (two) times daily with a meal.    . diclofenac sodium (VOLTAREN) 1 % GEL Apply 4 g topically 4 (four) times daily as needed (for joint pain of the knees, hands, elbows.). *Not to exceed 32 grams per day.*    .  ENSURE (ENSURE) Take by mouth daily. Mix 1/2 cup by mouth every day with whole milk. Consume between 6 pm meal and snack.    . finasteride (PROSCAR) 5 MG tablet Take 5 mg by mouth daily.    . Ipratropium-Albuterol (COMBIVENT RESPIMAT) 20-100 MCG/ACT AERS respimat Inhale 1 puff into the lungs every 6 (six) hours as needed for wheezing or shortness of breath. *Do not use spacers with this medication. Short acting inhaler for immediate relief of shortness of breath.*    . magnesium oxide (MAG-OX) 400 MG tablet Take 400 mg by mouth daily.    . Melatonin 3 MG TABS Take 3 mg by mouth at bedtime.    . metoprolol succinate (TOPROL-XL) 12.5 mg TB24 24 hr tablet Take 6.25 mg by mouth daily.    Marland Kitchen oxyCODONE (OXY IR/ROXICODONE) 5 MG immediate release tablet Take 0.5 tablets (2.5 mg total) by mouth every 6 (six) hours as needed for moderate pain or severe pain. (Patient taking differently: Take 2.5 mg by mouth 2 (two) times daily as needed for moderate pain or severe pain. ) 20 tablet 0  . senna-docusate (SENOKOT-S) 8.6-50 MG tablet Take 1 tablet by mouth daily.    . simvastatin (ZOCOR) 5 MG tablet Take 5 mg by mouth at bedtime.     . triamcinolone (KENALOG) 0.025 % cream Apply 1 application topically 2 (two) times daily as needed (for rash on back).    . warfarin (COUMADIN) 1 MG tablet Take 2 mg by mouth daily.       Discharge Medications: Please see discharge summary for a list of discharge medications.  Relevant Imaging Results:  Relevant Lab Results:   Additional Information SSN [id number]  Enis Slipper [place], [place]

## 2015-08-19 NOTE — Progress Notes (Signed)
LCSW met with daughter and her husband and suggested they go and get some nourishment from our cafe at this time. Nurses were going to apply hygiene practices on patient. LCSW will check in with family ant the end of the day. No further needs.  [employer] LCSW [phone]

## 2015-08-19 NOTE — H&P (Signed)
[place] [facility] [place] NAME: Daniel Huff    MR#:  [id number]  DATE OF BIRTH:  05/18/1926   DATE OF ADMISSION:  08/18/2015  PRIMARY CARE PHYSICIAN: Hilbert Corrigan, MD   REQUESTING/REFERRING PHYSICIAN: goodman  CHIEF COMPLAINT:   Chief Complaint  Patient presents with  . Altered Mental Status    HISTORY OF PRESENT ILLNESS:  Daniel Huff  is a 82 y.o. male with a known history of essential hypertension, pacemaker presenting with altered mental status. The patient is unable to provide meaningful information given mental status. Sent from group home due to "slow responses" ED workup fairly unrevealing, however mental status remains an issue. Had episodes of apnea and placed on CPAP overnight. Once again patient unable to provide information given mental status  PAST MEDICAL HISTORY:   Past Medical History  Diagnosis Date  . PVD (peripheral vascular disease) ([place])   . Hypertension   . DJD (degenerative joint disease)   . Ventricular tachycardia (paroxysmal) ([place])   . BPH (benign prostatic hyperplasia)   . Cardiomyopathy ([place])     PAST SURGICAL HISTORY:   Past Surgical History  Procedure Laterality Date  . Pacemaker placement    . Cardiac surgery      SOCIAL HISTORY:   Social History  Substance Use Topics  . Smoking status: Former Smoker    Quit date: 05/06/2012  . Smokeless tobacco: Not on file  . Alcohol Use: No    FAMILY HISTORY:   Family History  Problem Relation Age of Onset  . Heart failure Mother   . Heart failure Father     DRUG ALLERGIES:   Allergies  Allergen Reactions  . Aspirin Other (See Comments)    Reaction:  Unknown     REVIEW OF SYSTEMS:  Unable to obtain given mental status   MEDICATIONS AT HOME:   Prior to Admission medications   Medication Sig Start Date End Date Taking? Authorizing Provider  acetaminophen (TYLENOL) 325 MG tablet Take 2 tablets (650 mg total) by mouth every 6 (six) hours  as needed. 06/06/15   Demetrios Loll, MD  albuterol (PROVENTIL HFA;VENTOLIN HFA) 108 (90 Base) MCG/ACT inhaler Inhale 2 puffs into the lungs every 6 (six) hours as needed for wheezing or shortness of breath. 07/20/15   Daymon Larsen, MD  allopurinol (ZYLOPRIM) 100 MG tablet Take 100 mg by mouth daily.    Historical Provider, MD  Calcium Carbonate-Vitamin D (CALCIUM 600+D) 600-400 MG-UNIT per tablet Take 1 tablet by mouth 2 (two) times daily with a meal.    Historical Provider, MD  finasteride (PROSCAR) 5 MG tablet Take 5 mg by mouth daily.    Historical Provider, MD  magnesium oxide (MAG-OX) 400 MG tablet Take 400 mg by mouth daily.    Historical Provider, MD  Melatonin 3 MG TABS Take 3 mg by mouth at bedtime.    Historical Provider, MD  metoprolol succinate (TOPROL-XL) 25 MG 24 hr tablet Take 12.5 mg by mouth daily.    Historical Provider, MD  oxyCODONE (OXY IR/ROXICODONE) 5 MG immediate release tablet Take 0.5 tablets (2.5 mg total) by mouth every 6 (six) hours as needed for moderate pain or severe pain. 06/06/15   Demetrios Loll, MD  senna-docusate (SENOKOT-S) 8.6-50 MG tablet Take 1 tablet by mouth daily.    Historical Provider, MD  simvastatin (ZOCOR) 5 MG tablet Take 5 mg by mouth at bedtime.     Historical Provider, MD  triamcinolone (KENALOG) 0.025 %  cream Apply 1 application topically 2 (two) times daily as needed (for rash on back).    Historical Provider, MD  warfarin (COUMADIN) 1 MG tablet Take 2 mg by mouth daily.    Historical Provider, MD      VITAL SIGNS:  Blood pressure 127/81, pulse 82, temperature 98 F (36.7 C), temperature source Oral, resp. rate 28, height '5\' 9"'$  (1.753 m), weight 63.504 kg (140 lb), SpO2 96 %.  PHYSICAL EXAMINATION:   VITAL SIGNS: Filed Vitals:   08/19/15 0600 08/19/15 0630  BP: 140/83 127/81  Pulse: 80 82  Temp:    Resp: 31 28   GENERAL:82 y.o.male moderate distress given mental status.  HEAD: Normocephalic, atraumatic.  EYES: Pupils equal, round, reactive  to light. Unable to assess extraocular muscles given mental status/medical condition. No scleral icterus.  MOUTH: drymucosal membrane. Dentition poor. No abscess noted.  EAR, NOSE, THROAT: Clear without exudates. No external lesions.  NECK: Supple. No thyromegaly. No nodules. No JVD.  PULMONARY: Clear to ascultation, without wheeze rails or rhonci. No use of accessory muscles, Good respiratory effort. good air entry bilaterally CHEST: Nontender to palpation.  CARDIOVASCULAR: S1 and S2. Regular rate and rhythm. No murmurs, rubs, or gallops. 1+edema. Pedal pulses 2+ bilaterally.  GASTROINTESTINAL: Soft, nontender, nondistended. No masses. Positive bowel sounds. No hepatosplenomegaly.  MUSCULOSKELETAL: No swelling, clubbing, or edema. Passive Range of motion full in all extremities.  NEUROLOGIC: Unable to assess given mental status/medical condition SKIN: No ulceration, lesions, rashes, or cyanosis. Skin warm and dry. Turgor intact.  PSYCHIATRIC: Unable to assess given mental status/medical condition     LABORATORY PANEL:   CBC  Recent Labs Lab 08/18/15 2007  WBC 8.4  HGB 12.9*  HCT 38.7*  PLT 230   ------------------------------------------------------------------------------------------------------------------  Chemistries   Recent Labs Lab 08/18/15 2007  NA 140  K 3.9  CL 106  CO2 30  GLUCOSE 135*  BUN 39*  CREATININE 1.49*  CALCIUM 9.9  AST 35  ALT 40  ALKPHOS 80  BILITOT 0.5   ------------------------------------------------------------------------------------------------------------------  Cardiac Enzymes No results for input(s): TROPONINI in the last 168 hours. ------------------------------------------------------------------------------------------------------------------  RADIOLOGY:  Dg Chest 1 View  08/19/2015  CLINICAL DATA:  Acute onset of shortness of breath. Initial encounter. EXAM: CHEST 1 VIEW COMPARISON:  Chest radiograph performed 05/31/2015,  and CTA of the chest performed 07/20/2015 FINDINGS: The lungs are well-aerated. Small bilateral pleural effusions are noted. Bilateral central airspace opacification raises concern for pulmonary edema. There is no evidence of pneumothorax. The cardiomediastinal silhouette is borderline normal in size. The patient is status post median sternotomy, with evidence of prior CABG. A pacemaker is noted overlying the left chest wall, with leads ending overlying the right atrium, right ventricle and coronary sinus. A few orphaned leads are noted. The patient is status post median sternotomy. No acute osseous abnormalities are seen. Chronic deformity is noted at the left humeral head. IMPRESSION: Small bilateral pleural effusions noted. Bilateral central airspace opacification raises concern for pulmonary edema. Electronically Signed   By: Garald Balding M.D.   On: 08/19/2015 03:56   Ct Head Wo Contrast  08/19/2015  CLINICAL DATA:  Altered mental status. EXAM: CT HEAD WITHOUT CONTRAST TECHNIQUE: Contiguous axial images were obtained from the base of the skull through the vertex without intravenous contrast. COMPARISON:  05/31/2015 FINDINGS: There is no intracranial hemorrhage or extra-axial fluid collection. There is moderately severe generalized atrophy. There is extensive white matter hypodensity which is likely due to small vessel ischemic disease. There  are remote lacunar infarctions in the internal capsule genu bilaterally. No interval change is evident. No acute findings are evident. No bony abnormality. The visible paranasal sinuses are clear. Visible orbits are intact. IMPRESSION: No acute findings. There are remote lacunar infarctions, chronic small vessel changes, and moderately severe generalized atrophy. Electronically Signed   By: Andreas Newport M.D.   On: 08/19/2015 04:29   US Abdomen Limited Ruq  08/18/2015  CLINICAL DATA:  Decreased oral intake, right upper quadrant tenderness. EXAM: US ABDOMEN  LIMITED - RIGHT UPPER QUADRANT COMPARISON:  None. FINDINGS: Gallbladder: A shadowing echogenic stone measures 1.3 cm. No wall thickening. No sonographic Murphy sign. Common bile duct: Diameter: 6 mm, within normal limits. Liver: A 1.8 cm anechoic lesion with increased through transmission is indicative of a cyst. Parenchymal echogenicity is otherwise uniform. IMPRESSION: Cholelithiasis without acute cholecystitis. Electronically Signed   By: Lorin Picket M.D.   On: 08/18/2015 23:16    EKG:   Orders placed or performed during the hospital encounter of 08/18/15  . ED EKG  . ED EKG  . EKG 12-Lead  . EKG 12-Lead    IMPRESSION AND PLAN:   82 year old caucasian gentleman with history of cardiomyopathy, essential hypertension sent in from group home for mental status.  1. Metabolic encephalopathy: Unclear etiology at this time, avoid sedating agents, check ammonia level, neuro checks 2. Edema: Likely in the setting of mild acute on chronic congestive heart failure we'll provide gentle diuresis 3. Essential hypertension: Metoprolol 4. Hyperlipidemia unspecified statin therapy 5. Venous thromboembolism prophylactic therapeutic warfarin consult pharmacy for management    All the records are reviewed and case discussed with ED provider. Management plans discussed with the patient, family and they are in agreement.  CODE STATUS: Full  TOTAL TIME TAKING CARE OF THIS PATIENT: 33 minutes.    Ahmar Pickrell,  [name].D on 08/19/2015 at 7:40 AM  Between 7am to 6pm - Pager - [phone]  After 6pm: House Pager: - [phone]  [facility]  Office  [phone]  CC: Primary care physician; Hilbert Corrigan, MD

## 2015-08-19 NOTE — ED Notes (Signed)
Dr. Lavetta Nielsen at bedside.

## 2015-08-19 NOTE — ED Notes (Signed)
[facility] services group home and gave report to [place]. Per Narda Rutherford, she will contact her administrator and enquire about transportation from ER to home. Per Narda Rutherford she will call this RN with ETA once she has gotten Scientist, physiological. Charge Nurse Seth Bake made aware.

## 2015-08-19 NOTE — Clinical Social Work Note (Signed)
Clinical Social Work Assessment  Patient Details  Name: Daniel Huff MRN: [id number] Date of Birth: 06/22/1926  Date of referral:  08/12/15               Reason for consult:  Facility Placement                Permission sought to share information with:  Guardian, Facility Contact Representative Permission granted to share information::  Yes, Verbal Permission Granted  Name::     [facility] [phone] CEO- Lamonte Leath [phone] [name] Carr [phone] Guardian  Agency::  [facility]  Relationship::  yes  Contact Information:  yes  Housing/Transportation Living arrangements for the past 2 months:  Group Home Source of Information:  Other (Comment Required) (group Home Provider Lisa) Patient Interpreter Needed:  None Criminal Activity/Legal Involvement Pertinent to Current Situation/Hospitalization:  No - Comment as needed Significant Relationships:  Mental Health Provider Lives with:  Facility Resident Do you feel safe going back to the place where you live?  Yes Need for family participation in patient care:  No (Coment)  Care giving concerns: Patient was reasonable and mobile last week according to care giver. He started to have decreased appetite and needed assistance with dressing and bathing and personal grooming and was continent. Social Worker assessment / plan:  Patient was reasonable and mobile last week according to care giver. He started to have decreased appetite and needed assistance with dressing and bathing and personal grooming and was continent. Staff at group home noticed this deterioration and brought him to hospital. He does use a walker to safely go about the house but became weak in both legs and needed full assistance and used wheelchair. He is usually alert and oriented to person and place but this has changed in last week as well. LCSW will contact Group home CEO to see if patient needs can be met or if SNF is required. Patient is on  medicare part A and B  Patient was struggling to breath without  Cpap machine.     Employment status:  Retired Insurance information:  Medicare PT Recommendations:  Not assessed at this time Information / Referral to community resources:  Skilled Nursing Facility  Patient/Family's Response to care: Unable to reach, called patients Guardian [name] Carr [phone]  Patient/Family's Understanding of and Emotional Response to Diagnosis, Current Treatment, and Prognosis:  TBD  Emotional Assessment Appearance:  Appears stated age Attitude/Demeanor/Rapport:  Sedated, Lethargic Affect (typically observed):  Flat Orientation:  Fluctuating Orientation (Suspected and/or reported Sundowners), Oriented to Self Alcohol / Substance use:  Never Used Psych involvement (Current and /or in the community):  Outpatient Provider ([facility] H[facility])  Discharge Needs  Concerns to be addressed:  Care Coordination, Discharge Planning Concerns Readmission within the last 30 days:   No Current discharge risk:  Cognitively Impaired Barriers to Discharge:  Continued Medical Work up   ,  M, LCSW 08/19/2015, 10:19 AM  

## 2015-08-19 NOTE — ED Notes (Signed)
[place] x2. Spoke with Roselyn Reef a second time who states that she is unable to reach anyone with assistance for transport. Charge Nurse made aware.

## 2015-08-19 NOTE — Progress Notes (Signed)
Paged MD. Pt received to floor with orders for labs, meds, etc. Reprot received pt is comfort care only. Per Dr. Lavetta Nielsen, pt is NPO except meds, and only meds that are necessary for comfort, etc (haldol, ativan, morphine, etc). MD will D/C any orders other than comfort measures, pt is not to have any further diagnostics or labs while on comfort care. Pt currently resting in bed with family at bedside.

## 2015-08-19 NOTE — Consult Note (Signed)
[facility] Consultation      Assessment and Plan:  Acute respiratory failure, likely due to volume overload, pleural effusions, pulmonary edema.  -I discussed the possibility of diuresis with the patient's family, however, given the presence of pleural effusions, diuresis is unlikely to be helpful in resolving effusions, though may help some of with pulmonary edema. -We also discussed the possibility of thoracentesis, I explained that the patient will most likely require bilateral thoracenteses, however, the effusions may recur they feel that the patient would not want any further invasive procedures  Congestive heart failure. -Likely contributing to pleural effusions and pulmonary edema.  Lung Nodule.  -Suspect malignancy. Thoracentesis may help with diagnosis, if unrevealing, may need invasive procedure. We discussed that given the patient's severe deconditioning and debility, he would not be a good candidate for treatment for any underlying lung cancer. He also would not want any invasive procedures to diagnose this potential cancer.  Deconditioning, debility. -Patient currently lives in assisted living facility, with poor functional status, would therefore be poor candidate for invasive procedure, chemotherapy, and his likelihood of functional recovery is very poor.  Discussed with the family. The patient would want to be kept comfortable, and they want to assure that the patient is not suffering. Palliative care consultation orders have been answered, pain and agitation. Medications have artery been prescribed. Agree with proceeding with comfort measures only, may service will sign off for now, please call if there are any further questions or concerns.   Date: 08/19/2015  MRN# [id number] Daniel Huff November 08, 1926  Referring Physician:   Greysyn Huff is a 82 y.o. old male seen in consultation for chief complaint of:    Chief Complaint  Patient  presents with  . Altered Mental Status    HPI:   The patient is an 82 yo male he was found to incidentally have a 2.3 LLL lung nodule, when he had CT scanning on 05/31/15 after a fall. He was seen by oncology at that time, but was going to follow up at the [facility] for this, he was discharged to rehab. He was readmitted on 3/16 for dyspnea. The lung nodule was demonstrated on a CT which showed no PE.  At that time the patient was asked to follow up with Pulm, when he had an appt apparently on 4/5. He is currently readmitted from [facility] for altered mental status and respiratory failure.   The patient is currently fairly confused and minimally verbal, therefore, all history is obtained from the chart, staff, as well as the patient's wife, daughter and son-in-law for the bedside. They note that the patient's condition has declined progressively over the last several months and severely over the last 2-3 months. They noted the patient has been diagnosed with a lung nodule and they understand that he may have cancer, they did not think he would want any aggressive procedures to diagnose this to any one aggressive intervention such as chemotherapy. They think his functional status is poor, such that he is near death, and they think that he would want to be kept comfortable at this point.  CT and CXR imaging reviewed; Initial CT chest in 1/25 shows mild emphysematous changes, plus a sup segment of LLL nodule. CT 3/16 shows the addition of bilateral moderate pleural effusions. Repeat CXR shows increased bilat pleural effusions.    PMHX:   Past Medical History  Diagnosis Date  . PVD (peripheral vascular disease) ([place])   . Hypertension   .  DJD (degenerative joint disease)   . Ventricular tachycardia (paroxysmal) ([place])   . BPH (benign prostatic hyperplasia)   . Cardiomyopathy [facility])    Surgical Hx:  Past Surgical History  Procedure Laterality Date  . Pacemaker placement    . Cardiac surgery     Family Hx:    Family History  Problem Relation Age of Onset  . Heart failure Mother   . Heart failure Father    Social Hx:   Social History  Substance Use Topics  . Smoking status: Former Smoker    Quit date: 05/06/2012  . Smokeless tobacco: None  . Alcohol Use: No   Medication:   No current outpatient prescriptions on file.    Allergies:  Aspirin  Review of Systems: The patient is minimally verbal and cannot provide a review of systems.  Physical Examination:   VS: BP 161/110 mmHg  Pulse 107  Temp(Src) 98 F (36.7 C) (Oral)  Resp 18  Ht '5\' 9"'$  (1.753 m)  Wt 140 lb (63.504 kg)  BMI 20.67 kg/m2  SpO2 97%  General Appearance: No distress  Neuro:without focal findings,   HEENT: PERRLA, EOM intact.   Pulmonary: normal breath sounds, No wheezing. Decreased air entry bilaterally. CardiovascularNormal S1,S2.  No m/r/g.   Abdomen: Benign, Soft, non-tender. Renal:  No costovertebral tenderness  GU:  No performed at this time. Endoc: No evident thyromegaly, no signs of acromegaly. Skin:   warm, no rashes, no ecchymosis  Extremities: normal, no cyanosis, clubbing.  Other findings:    LABORATORY PANEL:   CBC  Recent Labs Lab 08/18/15 2007  WBC 8.4  HGB 12.9*  HCT 38.7*  PLT 230   ------------------------------------------------------------------------------------------------------------------  Chemistries   Recent Labs Lab 08/18/15 2007  NA 140  K 3.9  CL 106  CO2 30  GLUCOSE 135*  BUN 39*  CREATININE 1.49*  CALCIUM 9.9  AST 35  ALT 40  ALKPHOS 80  BILITOT 0.5   ------------------------------------------------------------------------------------------------------------------  Cardiac Enzymes No results for input(s): TROPONINI in the last 168 hours. ------------------------------------------------------------  RADIOLOGY:  Dg Chest 1 View  08/19/2015  CLINICAL DATA:  Acute onset of shortness of breath. Initial encounter. EXAM: CHEST 1 VIEW COMPARISON:   Chest radiograph performed 05/31/2015, and CTA of the chest performed 07/20/2015 FINDINGS: The lungs are well-aerated. Small bilateral pleural effusions are noted. Bilateral central airspace opacification raises concern for pulmonary edema. There is no evidence of pneumothorax. The cardiomediastinal silhouette is borderline normal in size. The patient is status post median sternotomy, with evidence of prior CABG. A pacemaker is noted overlying the left chest wall, with leads ending overlying the right atrium, right ventricle and coronary sinus. A few orphaned leads are noted. The patient is status post median sternotomy. No acute osseous abnormalities are seen. Chronic deformity is noted at the left humeral head. IMPRESSION: Small bilateral pleural effusions noted. Bilateral central airspace opacification raises concern for pulmonary edema. Electronically Signed   By: Garald Balding M.D.   On: 08/19/2015 03:56   Ct Head Wo Contrast  08/19/2015  CLINICAL DATA:  Altered mental status. EXAM: CT HEAD WITHOUT CONTRAST TECHNIQUE: Contiguous axial images were obtained from the base of the skull through the vertex without intravenous contrast. COMPARISON:  05/31/2015 FINDINGS: There is no intracranial hemorrhage or extra-axial fluid collection. There is moderately severe generalized atrophy. There is extensive white matter hypodensity which is likely due to small vessel ischemic disease. There are remote lacunar infarctions in the internal capsule genu bilaterally. No interval change is  evident. No acute findings are evident. No bony abnormality. The visible paranasal sinuses are clear. Visible orbits are intact. IMPRESSION: No acute findings. There are remote lacunar infarctions, chronic small vessel changes, and moderately severe generalized atrophy. Electronically Signed   By: Andreas Newport M.D.   On: 08/19/2015 04:29   US Abdomen Limited Ruq  08/18/2015  CLINICAL DATA:  Decreased oral intake, right upper  quadrant tenderness. EXAM: US ABDOMEN LIMITED - RIGHT UPPER QUADRANT COMPARISON:  None. FINDINGS: Gallbladder: A shadowing echogenic stone measures 1.3 cm. No wall thickening. No sonographic Murphy sign. Common bile duct: Diameter: 6 mm, within normal limits. Liver: A 1.8 cm anechoic lesion with increased through transmission is indicative of a cyst. Parenchymal echogenicity is otherwise uniform. IMPRESSION: Cholelithiasis without acute cholecystitis. Electronically Signed   By: Lorin Picket M.D.   On: 08/18/2015 23:16       Thank  you for the consultation and for allowing [facility] to assist in the care of your patient. Our recommendations are noted above.  Please contact us if we can be of further service.   Marda Stalker, MD.  Board Certified in Internal Medicine, Pulmonary Medicine, [place], and Sleep Medicine.  [facility] Office Number: [phone]  Patricia Pesa, M.D.  Vilinda Boehringer, M.D.  Merton Border, M.D  08/19/2015

## 2015-08-19 NOTE — ED Notes (Signed)
Pt continues to rest in bed at this time. Patient's family at bedside at this time. No change in patient condition. Pt awakens to mild verbal and physical stimuli.

## 2015-08-19 NOTE — ED Notes (Addendum)
Pt resting in bed. Awakens with mild verbal stimuli at this time. Pt refused to wear O2 sensor at this time. Pt denies needs a this time.

## 2015-08-19 NOTE — ED Notes (Signed)
Pt placed on CPAP d/t having spells of sleep apnea.

## 2015-08-19 NOTE — Progress Notes (Signed)
ANTICOAGULATION CONSULT NOTE - Initial Consult  Pharmacy Consult for Warfarin  Indication: VTE prophylaxis  Allergies  Allergen Reactions  . Aspirin Other (See Comments)    Reaction:  Unknown     Patient Measurements: Height: '5\' 9"'$  (175.3 cm) Weight: 140 lb (63.504 kg) IBW/kg (Calculated) : 70.7 Heparin Dosing Weight:   Vital Signs: BP: 161/110 mmHg (04/15 1535) Pulse Rate: 107 (04/15 1535)  Labs:  Recent Labs  08/18/15 2007 08/19/15 1552 08/19/15 1902  HGB 12.9* 13.6  --   HCT 38.7* 40.9  --   PLT 230 252  --   LABPROT 37.8*  --  39.2*  INR 3.97  --  4.17*  CREATININE 1.49* 1.58*  --     Estimated Creatinine Clearance: 30.1 mL/min (by C-G formula based on Cr of 1.58).   Medical History: Past Medical History  Diagnosis Date  . PVD (peripheral vascular disease) ([place])   . Hypertension   . DJD (degenerative joint disease)   . Ventricular tachycardia (paroxysmal) ([place])   . BPH (benign prostatic hyperplasia)   . Cardiomyopathy ([place])     Medications:  Prescriptions prior to admission  Medication Sig Dispense Refill Last Dose  . acetaminophen (TYLENOL) 325 MG tablet Take 2 tablets (650 mg total) by mouth every 6 (six) hours as needed. (Patient taking differently: Take 650 mg by mouth every 6 (six) hours. ) 30 tablet 2 08/18/2015 at Unknown time  . albuterol (PROVENTIL HFA;VENTOLIN HFA) 108 (90 Base) MCG/ACT inhaler Inhale 2 puffs into the lungs every 6 (six) hours as needed for wheezing or shortness of breath. 1 Inhaler 2 08/18/2015 at Unknown time  . allopurinol (ZYLOPRIM) 100 MG tablet Take 100 mg by mouth daily.   08/18/2015 at Unknown time  . Calcium Carbonate-Vitamin D (CALCIUM 600+D) 600-400 MG-UNIT per tablet Take 1 tablet by mouth 2 (two) times daily with a meal.   08/18/2015 at Unknown time  . diclofenac sodium (VOLTAREN) 1 % GEL Apply 4 g topically 4 (four) times daily as needed (for joint pain of the knees, hands, elbows.). *Not to exceed 32 grams per day.*    unknown  . ENSURE (ENSURE) Take by mouth daily. Mix 1/2 cup by mouth every day with whole milk. Consume between 6 pm meal and snack.   08/18/2015 at Unknown time  . finasteride (PROSCAR) 5 MG tablet Take 5 mg by mouth daily.   08/18/2015 at Unknown time  . Ipratropium-Albuterol (COMBIVENT RESPIMAT) 20-100 MCG/ACT AERS respimat Inhale 1 puff into the lungs every 6 (six) hours as needed for wheezing or shortness of breath. *Do not use spacers with this medication. Short acting inhaler for immediate relief of shortness of breath.*   08/18/2015 at Unknown time  . magnesium oxide (MAG-OX) 400 MG tablet Take 400 mg by mouth daily.   08/18/2015 at Unknown time  . Melatonin 3 MG TABS Take 3 mg by mouth at bedtime.   08/18/2015 at Unknown time  . metoprolol succinate (TOPROL-XL) 12.5 mg TB24 24 hr tablet Take 6.25 mg by mouth daily.   08/18/2015 at 0600  . oxyCODONE (OXY IR/ROXICODONE) 5 MG immediate release tablet Take 0.5 tablets (2.5 mg total) by mouth every 6 (six) hours as needed for moderate pain or severe pain. (Patient taking differently: Take 2.5 mg by mouth 2 (two) times daily as needed for moderate pain or severe pain. ) 20 tablet 0 08/18/2015 at Unknown time  . senna-docusate (SENOKOT-S) 8.6-50 MG tablet Take 1 tablet by mouth daily.   08/18/2015  at Unknown time  . simvastatin (ZOCOR) 5 MG tablet Take 5 mg by mouth at bedtime.    08/18/2015 at Unknown time  . triamcinolone (KENALOG) 0.025 % cream Apply 1 application topically 2 (two) times daily as needed (for rash on back).   unknown at Unknown time  . warfarin (COUMADIN) 1 MG tablet Take 2 mg by mouth daily.   08/18/2015 at Unknown time    Assessment: 4/15 :  INR = 4.15   Goal of Therapy:  INR 2-3     Plan:  INR on 4/15 = 4.15 .   Will hold warfarin until INR therapeutic and restart home dose.   Emin Foree D 08/19/2015,9:02 PM

## 2015-08-19 NOTE — ED Notes (Signed)
Pt taken off of Cpap by Respiratory. Respiratory initially present and RN continued to monitor pt. Pt placed on 5L and maintained O2 stat at 95% or higher but RR were rising and falling and pt had multiple episodes of apnea. RN placed pt back on Cpap.

## 2015-08-19 NOTE — ED Notes (Signed)
Report given to Amanda, RN

## 2015-08-19 NOTE — Progress Notes (Signed)
Returned from [place]. Pt remains on CPAP

## 2015-08-19 NOTE — Progress Notes (Signed)
LCSW met with ED nurse and patient will be admitted as he is struggling to breathe when Cpap machine removed. LCSW called Guardian Levander Campion [phone] left message, not available this weekend. Called Group Home to collect information on patient to collect data.LCSW will complete Fl2 and assessment. LCSW will contact Jerrye Noble [phone] to see if higher level of care is required.  [employer] LCSW [phone]

## 2015-08-19 NOTE — ED Provider Notes (Signed)
Assumed care from Dr. Joni Fears. Initial plan was to await ultrasound results and if negative discharge back to group home. The ultrasound results did not show any signs of acute cholecystitis. However the patient did continue to get weaker here in the emergency department. Patient continued be altered. Because of that I ordered a CT of the head and chest x-ray. Unclear etiology of the altered mental status. Will plan on admission to the hospitalist service.  While here in the emergency department the patient was noted to have periods of apnea while asleep. Because of this he was started on a CPAP.  Nance Pear, MD 08/19/15 [phone]

## 2015-08-19 NOTE — ED Notes (Signed)
Pt's family at bedside at this time. MD had conversation with family where family agrees to pursue comfort care at this time.

## 2015-08-19 NOTE — ED Notes (Addendum)
Administrator for group home came to get pt. Pt not responding at this time. MD made aware. Administartot will come back when pt can be dc'd

## 2015-08-19 NOTE — Progress Notes (Signed)
LCSW met with family and checked in with them. Patient is not wearing 02 and family ( daughter,husband,and ex wife) all in his room at this time. No needs identified.  [employer] LCSW [phone]

## 2015-08-19 NOTE — ED Notes (Signed)
Pt laying bed at this time, pt removed BiPap mask and O2 sensor and is refusing to put it back on. Pt agitated and aggressive with nursing staff. Charge RN notified, MD notified of patient's refusal. This RN spoke with patient regarding necessity of wearing mask and that he was in fact apneic while he was sleeping, pt continues to refuse to wear a mask.

## 2015-08-19 NOTE — Progress Notes (Signed)
Brief status update:  Patient currently on bipap in ED, unable to be weaned off  Given increased work of breathing and apnea Due to this new finding patient inpatient with diagnosis acute respiratory failure with hypoxia

## 2015-08-19 NOTE — Progress Notes (Signed)
Lengthy discussion with daughter/granddaughter at bedside. Patient with continuous recurrent apneic episodes More alert but agitated refuses CPAP, nasal cannula, further treatment options  Family informing patient has "bad COPD" as well as "lung cancer so bad that cannot be treated"  Given his general poor health we discussed CODE STATUS, for which everyone was in agreement for DO NOT RESUSCITATE stating he would not want those measures  I then further approached subjective palliative care which they were very receptive Will consult palliative care Family is in agreement for comfort measures at this time knowing with this change we will be focusing on symptom management including pain shortness of breath anxiety this decision ultimately lead to his death from underlying medical conditions. Family aware of this expressed their verbal understanding, all questions answered  Total time 66mns

## 2015-08-19 NOTE — ED Notes (Signed)
Dentures and clothing and wallet and glassess sent home with patient's family

## 2015-08-20 LAB — PROTIME-INR
INR: 4.01
Prothrombin Time: 38.1 seconds — ABNORMAL HIGH (ref 11.4–15.0)

## 2015-08-20 NOTE — Progress Notes (Signed)
[facility] NAME: Daniel Huff    MRN#:  [id number]  [place]:  08-26-1926  SUBJECTIVE:  Hospital Day: 1 day Daniel Huff is a 82 y.o. male presenting with Altered Mental Status .   Overnight events: no overnight events Interval Events: remains comfort measures only, unresponsive this morning with tachpnea,   REVIEW OF SYSTEMS:  Unable to obtain given mental status  DRUG ALLERGIES:   Allergies  Allergen Reactions  . Aspirin Other (See Comments)    Reaction:  Unknown     VITALS:  Blood pressure 161/110, pulse 107, temperature 98 F (36.7 C), temperature source Oral, resp. rate 18, height '5\' 9"'$  (1.753 m), weight 63.504 kg (140 lb), SpO2 97 %.  PHYSICAL EXAMINATION:   VITAL SIGNS: Filed Vitals:   08/19/15 1230 08/19/15 1535  BP: 156/88 161/110  Pulse: 76 107  Temp:    Resp: 40 18   GENERAL:82 y.o.male moderate distress given mental status.  HEAD: Normocephalic, atraumatic.  EYES: Pupils equal, round, reactive to light. Unable to assess extraocular muscles given mental status/medical condition. No scleral icterus.  MOUTH: Moist mucosal membrane. Dentition intact. No abscess noted.  EAR, NOSE, THROAT: Clear without exudates. No external lesions.  NECK: Supple. No thyromegaly. No nodules. No JVD.  PULMONARY: coarse breath sounds without wheeze rails or rhonci. No use of accessory muscles, Good respiratory effort. good air entry bilaterally CHEST: Nontender to palpation.  CARDIOVASCULAR: S1 and S2. Regular rate and rhythm. No murmurs, rubs, or gallops. No edema. Pedal pulses 2+ bilaterally.  GASTROINTESTINAL: Soft, nontender, nondistended. No masses. Positive bowel sounds. No hepatosplenomegaly.  MUSCULOSKELETAL: No swelling, clubbing, or edema. Range of motion full in all extremities.  NEUROLOGIC: Unable to assess given mental status/medical condition SKIN: No ulceration, lesions, rashes, or  cyanosis. Skin warm and dry. Turgor intact.  PSYCHIATRIC: Unable to assess given mental status/medical condition      LABORATORY PANEL:   CBC  Recent Labs Lab 08/19/15 1552  WBC 11.0*  HGB 13.6  HCT 40.9  PLT 252   ------------------------------------------------------------------------------------------------------------------  Chemistries   Recent Labs Lab 08/18/15 2007 08/19/15 1552  NA 140  --   K 3.9  --   CL 106  --   CO2 30  --   GLUCOSE 135*  --   BUN 39*  --   CREATININE 1.49* 1.58*  CALCIUM 9.9  --   AST 35  --   ALT 40  --   ALKPHOS 80  --   BILITOT 0.5  --    ------------------------------------------------------------------------------------------------------------------  Cardiac Enzymes No results for input(s): TROPONINI in the last 168 hours. ------------------------------------------------------------------------------------------------------------------  RADIOLOGY:  Dg Chest 1 View  08/19/2015  CLINICAL DATA:  Acute onset of shortness of breath. Initial encounter. EXAM: CHEST 1 VIEW COMPARISON:  Chest radiograph performed 05/31/2015, and CTA of the chest performed 07/20/2015 FINDINGS: The lungs are well-aerated. Small bilateral pleural effusions are noted. Bilateral central airspace opacification raises concern for pulmonary edema. There is no evidence of pneumothorax. The cardiomediastinal silhouette is borderline normal in size. The patient is status post median sternotomy, with evidence of prior CABG. A pacemaker is noted overlying the left chest wall, with leads ending overlying the right atrium, right ventricle and coronary sinus. A few orphaned leads are noted. The patient is status post median sternotomy. No acute osseous abnormalities are seen. Chronic deformity is noted at the left humeral head. IMPRESSION: Small bilateral pleural effusions noted. Bilateral central  airspace opacification raises concern for pulmonary edema. Electronically  Signed   By: Garald Balding M.D.   On: 08/19/2015 03:56   Ct Head Wo Contrast  08/19/2015  CLINICAL DATA:  Altered mental status. EXAM: CT HEAD WITHOUT CONTRAST TECHNIQUE: Contiguous axial images were obtained from the base of the skull through the vertex without intravenous contrast. COMPARISON:  05/31/2015 FINDINGS: There is no intracranial hemorrhage or extra-axial fluid collection. There is moderately severe generalized atrophy. There is extensive white matter hypodensity which is likely due to small vessel ischemic disease. There are remote lacunar infarctions in the internal capsule genu bilaterally. No interval change is evident. No acute findings are evident. No bony abnormality. The visible paranasal sinuses are clear. Visible orbits are intact. IMPRESSION: No acute findings. There are remote lacunar infarctions, chronic small vessel changes, and moderately severe generalized atrophy. Electronically Signed   By: Andreas Newport M.D.   On: 08/19/2015 04:29   US Abdomen Limited Ruq  08/18/2015  CLINICAL DATA:  Decreased oral intake, right upper quadrant tenderness. EXAM: US ABDOMEN LIMITED - RIGHT UPPER QUADRANT COMPARISON:  None. FINDINGS: Gallbladder: A shadowing echogenic stone measures 1.3 cm. No wall thickening. No sonographic Murphy sign. Common bile duct: Diameter: 6 mm, within normal limits. Liver: A 1.8 cm anechoic lesion with increased through transmission is indicative of a cyst. Parenchymal echogenicity is otherwise uniform. IMPRESSION: Cholelithiasis without acute cholecystitis. Electronically Signed   By: Lorin Picket M.D.   On: 08/18/2015 23:16    EKG:   Orders placed or performed during the hospital encounter of 08/18/15  . ED EKG  . ED EKG  . EKG 12-Lead  . EKG 12-Lead    ASSESSMENT AND PLAN:   Daniel Huff is a 82 y.o. male presenting with Altered Mental Status . Admitted 08/18/2015 : Day #: 1 day 1. Acute on chronic respiratory failure with hypoxia: in the setting  of COPD exacerbation, lung cancer.  2. Metabolic encephalopathy 3. essential hypertension 4.CAD  Remains comfort measures only, continue current medications. Palliative care consult pending - possible discharge to hospice home versus stay in hospital depending on medical status   All the records are reviewed and case discussed with Care Management/Social Workerr. Management plans discussed with the patient, family and they are in agreement.  CODE STATUS: dnr comfort measures TOTAL TIME TAKING CARE OF THIS PATIENT: 28 minutes.   POSSIBLE D/C IN 1-2DAYS, DEPENDING ON CLINICAL CONDITION.   Hower,  [name].D on 08/20/2015 at 12:09 PM  Between 7am to 6pm - Pager - [phone]  After 6pm: House Pager: - [phone]  [facility]  Office  [phone]  CC: Primary care physician; Hilbert Corrigan, MD

## 2015-08-20 NOTE — Progress Notes (Signed)
Daniel Huff from Lab states patient has INR of 4.01. Report given to oncoming nurse

## 2015-08-20 NOTE — Progress Notes (Signed)
ANTICOAGULATION CONSULT NOTE - Follow Up Consult  Pharmacy Consult for Warfarin  Indication: VTE prophylaxis  Allergies  Allergen Reactions  . Aspirin Other (See Comments)    Reaction:  Unknown     Patient Measurements: Height: '5\' 9"'$  (175.3 cm) Weight: 140 lb (63.504 kg) IBW/kg (Calculated) : 70.7     Labs:  Recent Labs  08/18/15 2007 08/19/15 1552 08/19/15 1902 08/20/15 0420  HGB 12.9* 13.6  --   --   HCT 38.7* 40.9  --   --   PLT 230 252  --   --   LABPROT 37.8*  --  39.2* 38.1*  INR 3.97  --  4.17* 4.01*  CREATININE 1.49* 1.58*  --   --     Estimated Creatinine Clearance: 30.1 mL/min (by C-G formula based on Cr of 1.58).   Medical History: Past Medical History  Diagnosis Date  . PVD (peripheral vascular disease) ([place])   . Hypertension   . DJD (degenerative joint disease)   . Ventricular tachycardia (paroxysmal) ([place])   . BPH (benign prostatic hyperplasia)   . Cardiomyopathy ([place])     Medications:  Prescriptions prior to admission  Medication Sig Dispense Refill Last Dose  . acetaminophen (TYLENOL) 325 MG tablet Take 2 tablets (650 mg total) by mouth every 6 (six) hours as needed. (Patient taking differently: Take 650 mg by mouth every 6 (six) hours. ) 30 tablet 2 08/18/2015 at Unknown time  . albuterol (PROVENTIL HFA;VENTOLIN HFA) 108 (90 Base) MCG/ACT inhaler Inhale 2 puffs into the lungs every 6 (six) hours as needed for wheezing or shortness of breath. 1 Inhaler 2 08/18/2015 at Unknown time  . allopurinol (ZYLOPRIM) 100 MG tablet Take 100 mg by mouth daily.   08/18/2015 at Unknown time  . Calcium Carbonate-Vitamin D (CALCIUM 600+D) 600-400 MG-UNIT per tablet Take 1 tablet by mouth 2 (two) times daily with a meal.   08/18/2015 at Unknown time  . diclofenac sodium (VOLTAREN) 1 % GEL Apply 4 g topically 4 (four) times daily as needed (for joint pain of the knees, hands, elbows.). *Not to exceed 32 grams per day.*   unknown  . ENSURE (ENSURE) Take by mouth daily.  Mix 1/2 cup by mouth every day with whole milk. Consume between 6 pm meal and snack.   08/18/2015 at Unknown time  . finasteride (PROSCAR) 5 MG tablet Take 5 mg by mouth daily.   08/18/2015 at Unknown time  . Ipratropium-Albuterol (COMBIVENT RESPIMAT) 20-100 MCG/ACT AERS respimat Inhale 1 puff into the lungs every 6 (six) hours as needed for wheezing or shortness of breath. *Do not use spacers with this medication. Short acting inhaler for immediate relief of shortness of breath.*   08/18/2015 at Unknown time  . magnesium oxide (MAG-OX) 400 MG tablet Take 400 mg by mouth daily.   08/18/2015 at Unknown time  . Melatonin 3 MG TABS Take 3 mg by mouth at bedtime.   08/18/2015 at Unknown time  . metoprolol succinate (TOPROL-XL) 12.5 mg TB24 24 hr tablet Take 6.25 mg by mouth daily.   08/18/2015 at 0600  . oxyCODONE (OXY IR/ROXICODONE) 5 MG immediate release tablet Take 0.5 tablets (2.5 mg total) by mouth every 6 (six) hours as needed for moderate pain or severe pain. (Patient taking differently: Take 2.5 mg by mouth 2 (two) times daily as needed for moderate pain or severe pain. ) 20 tablet 0 08/18/2015 at Unknown time  . senna-docusate (SENOKOT-S) 8.6-50 MG tablet Take 1 tablet by mouth daily.  08/18/2015 at Unknown time  . simvastatin (ZOCOR) 5 MG tablet Take 5 mg by mouth at bedtime.    08/18/2015 at Unknown time  . triamcinolone (KENALOG) 0.025 % cream Apply 1 application topically 2 (two) times daily as needed (for rash on back).   unknown at Unknown time  . warfarin (COUMADIN) 1 MG tablet Take 2 mg by mouth daily.   08/18/2015 at Unknown time    Assessment: 4/15:  INR = 4.15 4/16:  INR = 4.01   Goal of Therapy:  INR 2-3     Plan:  Will hold warfarin. Restart home dose when INR is therapeutic. Will recheck INR in the morning.   [place] Clinical Pharmacist 08/20/2015,8:49 AM

## 2015-08-21 LAB — PROTIME-INR
INR: 3.22
PROTHROMBIN TIME: 32.3 s — AB (ref 11.4–15.0)

## 2015-08-21 MED ORDER — WARFARIN - PHARMACIST DOSING INPATIENT: Freq: Every day | Status: DC

## 2015-08-21 MED ORDER — WARFARIN SODIUM 2 MG PO TABS: 1.0000 mg | ORAL_TABLET | Freq: Every day | ORAL | Status: DC

## 2015-08-21 NOTE — Progress Notes (Signed)
[facility] [place] NAME: Daniel Huff    MRN#:  [id number]  Daniel Huff:  20-Nov-1926  SUBJECTIVE:  Hospital Day: 2 days Daniel Huff is a 82 y.o. male presenting with Altered Mental Status .   Overnight events: no overnight events Interval Events: remains comfort measures only, intermittent confusion versus somnolence, patient not really eating or drinking  REVIEW OF SYSTEMS:  Unable to obtain given mental status  DRUG ALLERGIES:   Allergies  Allergen Reactions  . Aspirin Other (See Comments)    Reaction:  Unknown     VITALS:  Blood pressure 179/80, pulse 111, temperature 98 F (36.7 C), temperature source Oral, resp. rate 18, height '5\' 9"'$  (1.753 m), weight 63.504 kg (140 lb), SpO2 90 %.  PHYSICAL EXAMINATION:   VITAL SIGNS: Filed Vitals:   08/19/15 1535 08/21/15 0511  BP: 161/110 179/80  Pulse: 107 111  Resp: 18    GENERAL:82 y.o.male moderate distress given mental status.  HEAD: Normocephalic, atraumatic.  EYES: Pupils equal, round, reactive to light. Unable to assess extraocular muscles given mental status/medical condition. No scleral icterus.  MOUTH: Moist mucosal membrane. Dentition intact. No abscess noted.  EAR, NOSE, THROAT: Clear without exudates. No external lesions.  NECK: Supple. No thyromegaly. No nodules. No JVD.  PULMONARY: coarse breath sounds without wheeze rails or rhonci. No use of accessory muscles, Good respiratory effort. good air entry bilaterally CHEST: Nontender to palpation.  CARDIOVASCULAR: S1 and S2. Regular rate and rhythm. No murmurs, rubs, or gallops. No edema. Pedal pulses 2+ bilaterally.  GASTROINTESTINAL: Soft, nontender, nondistended. No masses. Positive bowel sounds. No hepatosplenomegaly.  MUSCULOSKELETAL: No swelling, clubbing, or edema. Range of motion full in all extremities.  NEUROLOGIC: Unable to assess given mental status/medical condition SKIN: No  ulceration, lesions, rashes, or cyanosis. Skin warm and dry. Turgor intact.  PSYCHIATRIC: Unable to assess given mental status/medical condition      LABORATORY PANEL:   CBC  Recent Labs Lab 08/19/15 1552  WBC 11.0*  HGB 13.6  HCT 40.9  PLT 252   ------------------------------------------------------------------------------------------------------------------  Chemistries   Recent Labs Lab 08/18/15 2007 08/19/15 1552  NA 140  --   K 3.9  --   CL 106  --   CO2 30  --   GLUCOSE 135*  --   BUN 39*  --   CREATININE 1.49* 1.58*  CALCIUM 9.9  --   AST 35  --   ALT 40  --   ALKPHOS 80  --   BILITOT 0.5  --    ------------------------------------------------------------------------------------------------------------------  Cardiac Enzymes No results for input(s): TROPONINI in the last 168 hours. ------------------------------------------------------------------------------------------------------------------  RADIOLOGY:  No results found.  EKG:   Orders placed or performed during the hospital encounter of 08/18/15  . ED EKG  . ED EKG  . EKG 12-Lead  . EKG 12-Lead    ASSESSMENT AND PLAN:   Daniel Huff is a 82 y.o. male presenting with Altered Mental Status . Admitted 08/18/2015 : Day #: 2 days 1. Acute on chronic respiratory failure with hypoxia: in the setting of COPD exacerbation, lung cancer.  2. Metabolic encephalopathy 3. essential hypertension 4.CAD  Remains comfort measures only, continue current medications. Palliative care consult pending - possible discharge to hospice home versus stay in hospital depending on medical status  She had extensive conversation with multiple failures at bedside in regards to level of care all questions answered  All the records are reviewed and case  discussed with Care Management/Social Workerr. Management plans discussed with the patient, family and they are in agreement.  CODE STATUS: dnr comfort  measures TOTAL TIME TAKING CARE OF THIS PATIENT: 33 minutes.   POSSIBLE D/C IN 1-2DAYS, DEPENDING ON CLINICAL CONDITION.   [name],  [name].D on 08/21/2015 at 1:54 PM  Between 7am to 6pm - Pager - [phone]  After 6pm: House Pager: - [facility]  Office  [phone]  CC: Primary care physician; Hilbert Corrigan, MD

## 2015-08-21 NOTE — Clinical Documentation Improvement (Signed)
Hospitalist  Can the diagnosis of CHF be further specified?    Acuity - Acute, Chronic, Acute on Chronic   Type - Systolic, Diastolic, Systolic and Diastolic  Other  Clinically Undetermined   Document any associated diagnoses/conditions   Supporting Information: CHF, likely contributing to pleural effusions and pulmonary edema per 4/15 progress notes.   Please exercise your independent, professional judgment when responding. A specific answer is not anticipated or expected.   Thank You,  [place] [phone]

## 2015-08-21 NOTE — Care Management Important Message (Signed)
Important Message  Patient Details  Name: Nickolus Wadding MRN: [id number] Date of Birth: 1927/03/04   Medicare Important Message Given:  Yes    Juliann Pulse A Maday Guarino 08/21/2015, 10:19 AM

## 2015-08-21 NOTE — Progress Notes (Signed)
ANTICOAGULATION CONSULT NOTE - Follow Up Consult  Pharmacy Consult for Warfarin  Indication: VTE prophylaxis  Allergies  Allergen Reactions  . Aspirin Other (See Comments)    Reaction:  Unknown     Patient Measurements: Height: '5\' 9"'$  (175.3 cm) Weight: 140 lb (63.504 kg) IBW/kg (Calculated) : 70.7     Labs:  Recent Labs  08/18/15 2007 08/19/15 1552 08/19/15 1902 08/20/15 0420 08/21/15 0742  HGB 12.9* 13.6  --   --   --   HCT 38.7* 40.9  --   --   --   PLT 230 252  --   --   --   LABPROT 37.8*  --  39.2* 38.1* 32.3*  INR 3.97  --  4.17* 4.01* 3.22  CREATININE 1.49* 1.58*  --   --   --     Estimated Creatinine Clearance: 30.1 mL/min (by C-G formula based on Cr of 1.58).   Medical History: Past Medical History  Diagnosis Date  . PVD (peripheral vascular disease) ([place])   . Hypertension   . DJD (degenerative joint disease)   . Ventricular tachycardia (paroxysmal) ([place])   . BPH (benign prostatic hyperplasia)   . Cardiomyopathy ([place])     Medications:  Prescriptions prior to admission  Medication Sig Dispense Refill Last Dose  . acetaminophen (TYLENOL) 325 MG tablet Take 2 tablets (650 mg total) by mouth every 6 (six) hours as needed. (Patient taking differently: Take 650 mg by mouth every 6 (six) hours. ) 30 tablet 2 08/18/2015 at Unknown time  . albuterol (PROVENTIL HFA;VENTOLIN HFA) 108 (90 Base) MCG/ACT inhaler Inhale 2 puffs into the lungs every 6 (six) hours as needed for wheezing or shortness of breath. 1 Inhaler 2 08/18/2015 at Unknown time  . allopurinol (ZYLOPRIM) 100 MG tablet Take 100 mg by mouth daily.   08/18/2015 at Unknown time  . Calcium Carbonate-Vitamin D (CALCIUM 600+D) 600-400 MG-UNIT per tablet Take 1 tablet by mouth 2 (two) times daily with a meal.   08/18/2015 at Unknown time  . diclofenac sodium (VOLTAREN) 1 % GEL Apply 4 g topically 4 (four) times daily as needed (for joint pain of the knees, hands, elbows.). *Not to exceed 32 grams per day.*    unknown  . ENSURE (ENSURE) Take by mouth daily. Mix 1/2 cup by mouth every day with whole milk. Consume between 6 pm meal and snack.   08/18/2015 at Unknown time  . finasteride (PROSCAR) 5 MG tablet Take 5 mg by mouth daily.   08/18/2015 at Unknown time  . Ipratropium-Albuterol (COMBIVENT RESPIMAT) 20-100 MCG/ACT AERS respimat Inhale 1 puff into the lungs every 6 (six) hours as needed for wheezing or shortness of breath. *Do not use spacers with this medication. Short acting inhaler for immediate relief of shortness of breath.*   08/18/2015 at Unknown time  . magnesium oxide (MAG-OX) 400 MG tablet Take 400 mg by mouth daily.   08/18/2015 at Unknown time  . Melatonin 3 MG TABS Take 3 mg by mouth at bedtime.   08/18/2015 at Unknown time  . metoprolol succinate (TOPROL-XL) 12.5 mg TB24 24 hr tablet Take 6.25 mg by mouth daily.   08/18/2015 at 0600  . oxyCODONE (OXY IR/ROXICODONE) 5 MG immediate release tablet Take 0.5 tablets (2.5 mg total) by mouth every 6 (six) hours as needed for moderate pain or severe pain. (Patient taking differently: Take 2.5 mg by mouth 2 (two) times daily as needed for moderate pain or severe pain. ) 20 tablet 0 08/18/2015  at Unknown time  . senna-docusate (SENOKOT-S) 8.6-50 MG tablet Take 1 tablet by mouth daily.   08/18/2015 at Unknown time  . simvastatin (ZOCOR) 5 MG tablet Take 5 mg by mouth at bedtime.    08/18/2015 at Unknown time  . triamcinolone (KENALOG) 0.025 % cream Apply 1 application topically 2 (two) times daily as needed (for rash on back).   unknown at Unknown time  . warfarin (COUMADIN) 1 MG tablet Take 2 mg by mouth daily.   08/18/2015 at Unknown time    Assessment: 4/15:  INR = 4.15 4/16:  INR = 4.01   Goal of Therapy:  INR 2-3     Plan:  Will order warfarin '1mg'$  q1800 (50% of home dose). Will recheck INR with am labs.   Pharmacy will continue to monitor and adjust per consult.   Cordney Barstow L, 08/21/2015,7:41 PM

## 2015-08-21 NOTE — Progress Notes (Deleted)
[facility] at [place] NAME: Bevin Mayall    MRN#:  [id number]  [place]:  1927/04/18  SUBJECTIVE:  Hospital Day: 2 days Yandel Zeiner is a 82 y.o. male presenting with Altered Mental Status .   Overnight events: no overnight events Interval Events: remains comfort measures only, intermittent confusion versus somnolence  REVIEW OF SYSTEMS:  Unable to obtain given mental status  DRUG ALLERGIES:   Allergies  Allergen Reactions  . Aspirin Other (See Comments)    Reaction:  Unknown     VITALS:  Blood pressure 179/80, pulse 111, temperature 98 F (36.7 C), temperature source Oral, resp. rate 18, height '5\' 9"'$  (1.753 m), weight 63.504 kg (140 lb), SpO2 90 %.  PHYSICAL EXAMINATION:   VITAL SIGNS: Filed Vitals:   08/19/15 1535 08/21/15 0511  BP: 161/110 179/80  Pulse: 107 111  Resp: 18    GENERAL:82 y.o.male moderate distress given mental status.  HEAD: Normocephalic, atraumatic.  EYES: Pupils equal, round, reactive to light. Unable to assess extraocular muscles given mental status/medical condition. No scleral icterus.  MOUTH: Moist mucosal membrane. Dentition intact. No abscess noted.  EAR, NOSE, THROAT: Clear without exudates. No external lesions.  NECK: Supple. No thyromegaly. No nodules. No JVD.  PULMONARY: coarse breath sounds without wheeze rails or rhonci. No use of accessory muscles, Good respiratory effort. good air entry bilaterally CHEST: Nontender to palpation.  CARDIOVASCULAR: S1 and S2. Regular rate and rhythm. No murmurs, rubs, or gallops. No edema. Pedal pulses 2+ bilaterally.  GASTROINTESTINAL: Soft, nontender, nondistended. No masses. Positive bowel sounds. No hepatosplenomegaly.  MUSCULOSKELETAL: No swelling, clubbing, or edema. Range of motion full in all extremities.  NEUROLOGIC: Unable to assess given mental status/medical condition SKIN: No ulceration, lesions, rashes, or cyanosis. Skin  warm and dry. Turgor intact.  PSYCHIATRIC: Unable to assess given mental status/medical condition      LABORATORY PANEL:   CBC  Recent Labs Lab 08/19/15 1552  WBC 11.0*  HGB 13.6  HCT 40.9  PLT 252   ------------------------------------------------------------------------------------------------------------------  Chemistries   Recent Labs Lab 08/18/15 2007 08/19/15 1552  NA 140  --   K 3.9  --   CL 106  --   CO2 30  --   GLUCOSE 135*  --   BUN 39*  --   CREATININE 1.49* 1.58*  CALCIUM 9.9  --   AST 35  --   ALT 40  --   ALKPHOS 80  --   BILITOT 0.5  --    ------------------------------------------------------------------------------------------------------------------  Cardiac Enzymes No results for input(s): TROPONINI in the last 168 hours. ------------------------------------------------------------------------------------------------------------------  RADIOLOGY:  No results found.  EKG:   Orders placed or performed during the hospital encounter of 08/18/15  . ED EKG  . ED EKG  . EKG 12-Lead  . EKG 12-Lead    ASSESSMENT AND PLAN:   Jagjit Riner is a 82 y.o. male presenting with Altered Mental Status . Admitted 08/18/2015 : Day #: 2 days 1. Acute on chronic respiratory failure with hypoxia: in the setting of COPD exacerbation, lung cancer.  2. Metabolic encephalopathy 3. essential hypertension 4.CAD  Remains comfort measures only, continue current medications. Palliative care consult pending - possible discharge to hospice home versus stay in hospital depending on medical status  She had extensive conversation with multiple failures at bedside in regards to level of care all questions answered  All the records are reviewed and case discussed with Care Management/Social Workerr. Management  plans discussed with the patient, family and they are in agreement.  CODE STATUS: dnr comfort measures TOTAL TIME TAKING CARE OF THIS PATIENT: 33  minutes.   POSSIBLE D/C IN 1-2DAYS, DEPENDING ON CLINICAL CONDITION.   Hower,  [name].D on 08/21/2015 at 1:53 PM  Between 7am to 6pm - Pager - [phone]  After 6pm: House Pager: - [facility]  Office  [phone]  CC: Primary care physician; Hilbert Corrigan, MD

## 2015-08-21 NOTE — Progress Notes (Signed)
Nutrition Brief Note   Pt triggered for assessment secondary to Malnutrition Screening Tool. Chart reviewed. Pt now transitioning to comfort care.  Pt remains on Soft Diet order. No further nutrition interventions warranted at this time.  Please re-consult as needed.   Dwyane Luo, RD, LDN Pager [phone] Weekend/On-Call Pager [phone]

## 2015-08-21 NOTE — Progress Notes (Signed)
LCSW called and left a detailed message for patients guardian Daniel Huff DSS [place]. No further needs at this time  [employer] LCSW [phone]

## 2015-08-22 DIAGNOSIS — Z8673 Personal history of transient ischemic attack (TIA), and cerebral infarction without residual deficits: Secondary | ICD-10-CM

## 2015-08-22 DIAGNOSIS — Z515 Encounter for palliative care: Secondary | ICD-10-CM

## 2015-08-22 DIAGNOSIS — Z9181 History of falling: Secondary | ICD-10-CM

## 2015-08-22 DIAGNOSIS — J9621 Acute and chronic respiratory failure with hypoxia: Principal | ICD-10-CM

## 2015-08-22 DIAGNOSIS — I2510 Atherosclerotic heart disease of native coronary artery without angina pectoris: Secondary | ICD-10-CM

## 2015-08-22 DIAGNOSIS — J449 Chronic obstructive pulmonary disease, unspecified: Secondary | ICD-10-CM

## 2015-08-22 DIAGNOSIS — R5383 Other fatigue: Secondary | ICD-10-CM

## 2015-08-22 DIAGNOSIS — Z66 Do not resuscitate: Secondary | ICD-10-CM

## 2015-08-22 DIAGNOSIS — Z79899 Other long term (current) drug therapy: Secondary | ICD-10-CM

## 2015-08-22 DIAGNOSIS — C3492 Malignant neoplasm of unspecified part of left bronchus or lung: Secondary | ICD-10-CM

## 2015-08-22 DIAGNOSIS — J90 Pleural effusion, not elsewhere classified: Secondary | ICD-10-CM

## 2015-08-22 DIAGNOSIS — Z8781 Personal history of (healed) traumatic fracture: Secondary | ICD-10-CM

## 2015-08-22 DIAGNOSIS — Z7901 Long term (current) use of anticoagulants: Secondary | ICD-10-CM

## 2015-08-22 DIAGNOSIS — R531 Weakness: Secondary | ICD-10-CM

## 2015-08-22 DIAGNOSIS — Z87891 Personal history of nicotine dependence: Secondary | ICD-10-CM

## 2015-08-22 LAB — PROTIME-INR
INR: 3.59
PROTHROMBIN TIME: 35 s — AB (ref 11.4–15.0)

## 2015-08-22 LAB — MRSA PCR SCREENING: MRSA BY PCR: NEGATIVE

## 2015-08-22 MED ORDER — LORAZEPAM 2 MG/ML IJ SOLN: 0.5000 mg | INTRAMUSCULAR | Status: DC | PRN

## 2015-08-22 MED ORDER — BISACODYL 10 MG RE SUPP: 10.0000 mg | Freq: Every day | RECTAL | Status: AC | PRN

## 2015-08-22 MED ORDER — IPRATROPIUM-ALBUTEROL 0.5-2.5 (3) MG/3ML IN SOLN: 3.0000 mL | RESPIRATORY_TRACT | Status: AC | PRN

## 2015-08-22 MED ORDER — PROCHLORPERAZINE 25 MG RE SUPP: 25.0000 mg | Freq: Two times a day (BID) | RECTAL | Status: AC | PRN

## 2015-08-22 MED ORDER — HYDRALAZINE HCL 20 MG/ML IJ SOLN
10.0000 mg | Freq: Once | INTRAMUSCULAR | Status: AC
  Administered 2015-08-22: 10 mg via INTRAVENOUS
  Filled 2015-08-22: qty 1

## 2015-08-22 MED ORDER — MORPHINE SULFATE (CONCENTRATE) 10 MG/0.5ML PO SOLN: 5.0000 mg | ORAL | Status: AC | PRN

## 2015-08-22 MED ORDER — LORAZEPAM 0.5 MG PO TABS: 0.5000 mg | ORAL_TABLET | ORAL | Status: AC | PRN

## 2015-08-22 MED ORDER — MORPHINE SULFATE (CONCENTRATE) 10 MG/0.5ML PO SOLN
5.0000 mg | Freq: Three times a day (TID) | ORAL | Status: DC
  Administered 2015-08-22 – 2015-08-23 (×2): 5 mg via ORAL
  Filled 2015-08-22 (×2): qty 0.5

## 2015-08-22 MED ORDER — BISACODYL 10 MG RE SUPP
10.0000 mg | Freq: Every day | RECTAL | Status: DC | PRN
Start: 2015-08-22 — End: 2015-08-23

## 2015-08-22 MED ORDER — CETYLPYRIDINIUM CHLORIDE 0.05 % MT LIQD
7.0000 mL | Freq: Two times a day (BID) | OROMUCOSAL | Status: DC
  Administered 2015-08-22 – 2015-08-23 (×2): 7 mL via OROMUCOSAL

## 2015-08-22 NOTE — Progress Notes (Signed)
LCSW is following acutely with patient and medical team pending disposition. Discussed case with DSS worker as well as CM, palliative care MD, and Santiago Glad (hospice home RN). Santiago Glad is aware of case and LCSW is assisting as needed with regards to disposition.  DSS worker: spoke with family and medical team. She is in agreement with hospice home as patient needing higher level of intervention medically than group home can offer. Ultimately do to patient being a long term resident she would like him to return there, but she is aware of needs and referral can be sent for hospice home.  Santiago Glad made aware of plan referral.  LCSW will continue to assist with DC needs.  Lane Hacker, MSW Clinical Social Work: Printmaker (Coverage for [place]:  [phone])

## 2015-08-22 NOTE — Progress Notes (Signed)
ANTICOAGULATION CONSULT NOTE - Follow Up Consult  Pharmacy Consult for Warfarin  Indication: VTE prophylaxis  Allergies  Allergen Reactions  . Aspirin Other (See Comments)    Reaction:  Unknown     Patient Measurements: Height: '5\' 9"'$  (175.3 cm) Weight: 140 lb (63.504 kg) IBW/kg (Calculated) : 70.7     Labs:  Recent Labs  08/19/15 1552  08/20/15 0420 08/21/15 0742 08/22/15 0513  HGB 13.6  --   --   --   --   HCT 40.9  --   --   --   --   PLT 252  --   --   --   --   LABPROT  --   < > 38.1* 32.3* 35.0*  INR  --   < > 4.01* 3.22 3.59  CREATININE 1.58*  --   --   --   --   < > = values in this interval not displayed.  Estimated Creatinine Clearance: 30.1 mL/min (by C-G formula based on Cr of 1.58).   Medical History: Past Medical History  Diagnosis Date  . PVD (peripheral vascular disease) ([place])   . Hypertension   . DJD (degenerative joint disease)   . Ventricular tachycardia (paroxysmal) ([place])   . BPH (benign prostatic hyperplasia)   . Cardiomyopathy ([place])     Medications:  Prescriptions prior to admission  Medication Sig Dispense Refill Last Dose  . acetaminophen (TYLENOL) 325 MG tablet Take 2 tablets (650 mg total) by mouth every 6 (six) hours as needed. (Patient taking differently: Take 650 mg by mouth every 6 (six) hours. ) 30 tablet 2 08/18/2015 at Unknown time  . albuterol (PROVENTIL HFA;VENTOLIN HFA) 108 (90 Base) MCG/ACT inhaler Inhale 2 puffs into the lungs every 6 (six) hours as needed for wheezing or shortness of breath. 1 Inhaler 2 08/18/2015 at Unknown time  . allopurinol (ZYLOPRIM) 100 MG tablet Take 100 mg by mouth daily.   08/18/2015 at Unknown time  . Calcium Carbonate-Vitamin D (CALCIUM 600+D) 600-400 MG-UNIT per tablet Take 1 tablet by mouth 2 (two) times daily with a meal.   08/18/2015 at Unknown time  . diclofenac sodium (VOLTAREN) 1 % GEL Apply 4 g topically 4 (four) times daily as needed (for joint pain of the knees, hands, elbows.). *Not to  exceed 32 grams per day.*   unknown  . ENSURE (ENSURE) Take by mouth daily. Mix 1/2 cup by mouth every day with whole milk. Consume between 6 pm meal and snack.   08/18/2015 at Unknown time  . finasteride (PROSCAR) 5 MG tablet Take 5 mg by mouth daily.   08/18/2015 at Unknown time  . Ipratropium-Albuterol (COMBIVENT RESPIMAT) 20-100 MCG/ACT AERS respimat Inhale 1 puff into the lungs every 6 (six) hours as needed for wheezing or shortness of breath. *Do not use spacers with this medication. Short acting inhaler for immediate relief of shortness of breath.*   08/18/2015 at Unknown time  . magnesium oxide (MAG-OX) 400 MG tablet Take 400 mg by mouth daily.   08/18/2015 at Unknown time  . Melatonin 3 MG TABS Take 3 mg by mouth at bedtime.   08/18/2015 at Unknown time  . metoprolol succinate (TOPROL-XL) 12.5 mg TB24 24 hr tablet Take 6.25 mg by mouth daily.   08/18/2015 at 0600  . oxyCODONE (OXY IR/ROXICODONE) 5 MG immediate release tablet Take 0.5 tablets (2.5 mg total) by mouth every 6 (six) hours as needed for moderate pain or severe pain. (Patient taking differently: Take 2.5 mg  by mouth 2 (two) times daily as needed for moderate pain or severe pain. ) 20 tablet 0 08/18/2015 at Unknown time  . senna-docusate (SENOKOT-S) 8.6-50 MG tablet Take 1 tablet by mouth daily.   08/18/2015 at Unknown time  . simvastatin (ZOCOR) 5 MG tablet Take 5 mg by mouth at bedtime.    08/18/2015 at Unknown time  . triamcinolone (KENALOG) 0.025 % cream Apply 1 application topically 2 (two) times daily as needed (for rash on back).   unknown at Unknown time  . warfarin (COUMADIN) 1 MG tablet Take 2 mg by mouth daily.   08/18/2015 at Unknown time    Assessment: Pharmacy consulted to dose warfarin for 82 yo male being evaluated for comfort measures. Patient received warfarin '1mg'$  PO x 1 on 4/18.   Goal of Therapy:  INR 2-3     Plan:  Will hold warfarin today. Will recheck INR with am labs.   Pharmacy will continue to monitor and  adjust per consult.   Berenice Oehlert L, 08/22/2015,9:58 AM

## 2015-08-22 NOTE — Progress Notes (Signed)
Palliative Care Update  I am trying to confirm pts DNR order.  I had spoken in person with pt's guardian, Levander Campion, this morning. She had shown up with guardianship papers and she conveyed that pt's DNR status was in question b/c they (the guardians) had not been asked about this.  I asked her to expedite this request to consider DNR and she made a phone call to the proper person that has to approve this matter.  I have seen notes confirming that pt is OK to go to [facility], but an MD must have a conversation with the guardian (which I have had) and must get the approval for the DNR directly from the guardian.    I have left messages for the guardian and am continuing to call various numbers with DSS leaving a request for the approval of the DNR asap.  I have talked with Dr Anselm Jungling and he and I agree that without notice of approval of DNR, pt is actually full code status.  I will continue calling the various numbers and have left three messages so far.   Also --there are no beds available for Hospice Home today.  Pt should be able to go there tomorrow morning, per Hospice Liaison.  Colleen Can, MD

## 2015-08-22 NOTE — Plan of Care (Signed)
Dietary contacted to request Bereavement cart.

## 2015-08-22 NOTE — Progress Notes (Signed)
Daniel Huff at [place] NAME: Daniel Huff    MRN#:  [id number]  Daniel Huff:  1926-12-16  SUBJECTIVE:  Hospital Day: 3 days Daniel Huff is a 82 y.o. male presenting with Altered Mental Status .   Overnight events: no overnight events Interval Events: remains comfort measures only, intermittent confusion versus somnolence, patient not really eating or drinking, Drowsy, barely arousable.  REVIEW OF SYSTEMS:  Unable to obtain given mental status  DRUG ALLERGIES:   Allergies  Allergen Reactions  . Aspirin Other (See Comments)    Reaction:  Unknown     VITALS:  Blood pressure 170/106, pulse 106, temperature 98.1 F (36.7 C), temperature source Oral, resp. rate 20, height '5\' 9"'$  (1.753 m), weight 63.504 kg (140 lb), SpO2 94 %.  PHYSICAL EXAMINATION:   VITAL SIGNS: Filed Vitals:   08/21/15 0511 08/22/15 1059  BP: 179/80 170/106  Pulse: 111 106  Temp:  98.1 F (36.7 C)  Resp:  20   GENERAL:82 y.o.male moderate distress given mental status.  HEAD: Normocephalic, atraumatic.  EYES: Pupils equal, round, reactive to light. Unable to assess extraocular muscles given mental status/medical condition. No scleral icterus.  MOUTH: Moist mucosal membrane. Dentition intact. No abscess noted.  EAR, NOSE, THROAT: Clear without exudates. No external lesions.  NECK: Supple. No thyromegaly. No nodules. No JVD.  PULMONARY: coarse breath sounds without wheeze rails or rhonci. No use of accessory muscles, good air entry bilaterally CHEST: Nontender to palpation.  CARDIOVASCULAR: S1 and S2. Regular rate and rhythm. No murmurs, No edema. Pedal pulses 2+ bilaterally.  GASTROINTESTINAL: Soft, nontender, nondistended. No masses. Positive bowel sounds. No hepatosplenomegaly.  MUSCULOSKELETAL: No swelling, clubbing, or edema. Range of motion full in all extremities.  NEUROLOGIC: Unable to assess given mental status/medical condition SKIN:  No ulceration, lesions, rashes, or cyanosis. Skin warm and dry. Turgor intact.  PSYCHIATRIC: Unable to assess given mental status/medical condition    LABORATORY PANEL:   CBC  Recent Labs Lab 08/19/15 1552  WBC 11.0*  HGB 13.6  HCT 40.9  PLT 252   ------------------------------------------------------------------------------------------------------------------  Chemistries   Recent Labs Lab 08/18/15 2007 08/19/15 1552  NA 140  --   K 3.9  --   CL 106  --   CO2 30  --   GLUCOSE 135*  --   BUN 39*  --   CREATININE 1.49* 1.58*  CALCIUM 9.9  --   AST 35  --   ALT 40  --   ALKPHOS 80  --   BILITOT 0.5  --    ------------------------------------------------------------------------------------------------------------------  Cardiac Enzymes No results for input(s): TROPONINI in the last 168 hours. ------------------------------------------------------------------------------------------------------------------  RADIOLOGY:  No results found.  EKG:   Orders placed or performed during the hospital encounter of 08/18/15  . ED EKG  . ED EKG  . EKG 12-Lead  . EKG 12-Lead    ASSESSMENT AND PLAN:   Daniel Huff is a 82 y.o. male presenting with Altered Mental Status . Admitted 08/18/2015 : Day #: 3 days 1. Acute on chronic respiratory failure with hypoxia: in the setting of COPD exacerbation, lung cancer.    Now on comfort care, plan for hospice.   Ch Diastolic heart failure also present, no exacerbation. 2. Metabolic encephalopathy 3. essential hypertension 4.CAD  Remains comfort measures only, continue current medications. Palliative care consult appreciated. Dr. Megan Salon had discussion with DSS and family to make to hospice plan clear today.  d/c to hospice home  tomorrow.  All the records are reviewed and case discussed with Care Management/Social Workerr. Management plans discussed with the patient, family and they are in agreement.  CODE STATUS: dnr  comfort measures TOTAL TIME TAKING CARE OF THIS PATIENT: 30 minutes.   POSSIBLE D/C IN 1-2DAYS, DEPENDING ON CLINICAL CONDITION.   Vaughan Basta M.D on 08/22/2015 at 5:00 PM  Between 7am to 6pm - Pager - [phone]  After 6pm: House Pager: - [facility]  Office  [phone]  CC: Primary care physician; Hilbert Corrigan, MD

## 2015-08-22 NOTE — Consult Note (Addendum)
Palliative Medicine Inpatient Consult Note   Name: Daniel Huff Date: 08/22/2015 MRN: [id number]  DOB: 1926-12-01  Referring Physician: Vaughan Basta, MD  Palliative Care consult requested for this 82 y.o. male for goals of medical therapy in patient with COPD and lung cancer with metabolic encephalopathy.   TODAY'S DISCUSSIONS AND DECISIONS: Consult was requested late on Saturday--- and I noted consult on Monday (and also discussed pt with care manager, Colletta Maryland).  I came to see pt this am and noted that [place] referral was ordered.  But, there was also a question as to whether pt is stable enough for transfer.  I ordered Vitals for pt.  Once vitals were obtained, it is apparent that, as of today, he would be stable for transport (BP is actually high).  I spoke to family since there were a number of family members bedside. They showed me the Hospice Choice list and wanted to discuss this. They asked if pt could 'just stay here' as they had thought they heard something about this being an option this morning.  I explained the difference between Hospice in the home/ facility and also explained Hospice Home to the family so they would have a good understanding of the differences. Then, I spoke with care manager and Education officer, museum.  At this point, the pt's guardian walked up and we also talked. She stated she wanted pt to go back to his group home for hospice there. I discussed the need for skilled nursing to be able to manage his symptoms of pain and to be able to administer prn narcotic medications and other meds for his symptoms. I conveyed that I felt that his level of care need is fairly high and that it would be best for him to have round the clock nursing care.    The pt was made DNR and comfort care on Saturday --but apparently DSS was not involved.  Today, I spoke with the guardian and asked her to confirm the DNR and she was to call me back once she did so.  She then  spoke with family about disposition location and she conveyed to social worker that it is OK for pt to go to [facility].   There is not a Hospice home bed available until tomorrow per Hospice Liaison.  Pt needed some changes to comfort oriented medications and these are ordered by me.    Pt's discharge meds are ordered by me.  He will need daily vital signs to make sure he is stable for transport.  These are ordered and will be re-evaluated in the morning.   I have so far made three phone calls to DSS to get the official approval for pt to be DNR. Awaiting call back. Have called Eduard Roux (who stated earlier that she herself agreed with DNR but she does not have authority to verbally consent to this) and  Minna Merritts Day [phone]) and another at [phone]).     At 2:10 today, I received a call back from [facility], who has the authority to consent to a DNR. She agrees with the appropriateness of this status (after I described the pts condition and prognosis).  I am now able to fulfill the mandatory  requirement that I have a discussion myself with the decision maker and am documenting this consent for DNR.    PERTINENT HISTORY: Pt presented with a fall and right sided chest pain in late January.  A Chest Ct showed acute and subacute rib fractures  and also a 2.3 cm spiculated mass in the left lower lobe of lung.  Lumbar spine Ct shoed a 1.2 cm right lower love  Subpleural mass vs atelectasis. He was to have outpatient oncology evaluation and was discharged to [facility] for rehabilitation.  He did not follow up at the [facility] hospital.  He did go for a check up with Dr. Niger Reid last week and was referred to pulmonary medicine for an appt on 08/09/15.  He was seen in the cancer center office by Dr. Mike Gip on 08/03/15.  She scheduled an outpatient PET scan and anticipated a biopsy.  He was considered a poor candidate for surgery due to his age and performance status.    Pt was seen by a  pulmonologist on 4/5 and he returned to see Dr. Mike Gip on 4/10.  He reported eating 'so-so'.  PET scan was to be re-scheduled and care co-ordinated with [facility], pulmonologist, and Dr. Niger Reid.    On 08/18/15, pt came here with altered mental status.  He had acute respiratory failure and was placed on CPAP then BIPAP.  Attending had discussion regarding a palliative care consult and DNR on 4/15 with the many family members present.    Pt has a pacemaker and has been on coumadin ( unclear reason but believed to be related to severe cardiomyopathy --with echo presumably done at outside hospital?) He has a h/o CAD and CABG also.    I have just now (at 2:10 pm today) gotten a call back from [place] Day at [facility] # [phone]). She has the authority to make a DNR decision for pt. I informed her of his condition and medical course and she agreed with DNR.  I have now completed a DNR portable form for the paper chart and re-ordered DNR status.    I have visited pt again and see that his resp rate is higher --above 30 now.  Will ask for O2 to be placed and wills start some low dose routine morphine.  If he declines here and becomes unstable, he will not be a Hospice Home pt so we will have to see how he is tomorrow morning.    -------------------------------------------------------------------------------  IMPRESSION: Acute on chronic respiratory failure ---now on room air with sats 93% Poor oral intake (now not eating though albumin was 4.1 on 4/14 ( he was a bit dehydrated on that date so lab may be falsely higher than true value) COPD --end stage Weakness Altered mental status --very lethargic History of lacunar infarcts CAD with CABG Pacemaker placement Primary lung cancer ---not deemed to be a surgical candidate due to poor performance status, age, and ill health ---was being followed by Dr. Mike Gip    IMPRESSION: Acute on chronic resp failure with hypoxia CXRs show small bilateral  pleural effusions and concern for pulmonary edema and pacemaker and prior CABG  Lung Cancer  COPD --advanced Metabolic encephalopathy Weakness HTN CAD Coumadin RX --unclear diagnosis    REVIEW OF SYSTEMS:  Patient is not able to provide ROS due to illness.   SPIRITUAL SUPPORT SYSTEM: A number of family members and friends are in and out of room --but pt has a legal guardian with DSS  SOCIAL HISTORY:  reports that he quit smoking about 3 years ago. He does not have any smokeless tobacco history on file. He reports that he does not drink alcohol. Pt has legal guardian with DSS: Levander Campion at [phone].  He has various family and friends who are  often in the room.     LEGAL DOCUMENTS:  Guardianship papers are in the paper chart.    CODE STATUS: DNR is re-instated now at 2:10 pm.   PAST MEDICAL HISTORY: Past Medical History  Diagnosis Date  . PVD (peripheral vascular disease) ([place])   . Hypertension   . DJD (degenerative joint disease)   . Ventricular tachycardia (paroxysmal) ([place])   . BPH (benign prostatic hyperplasia)   . Cardiomyopathy ([place])     PAST SURGICAL HISTORY:  Past Surgical History  Procedure Laterality Date  . Pacemaker placement    . Cardiac surgery      ALLERGIES:  is allergic to aspirin.  MEDICATIONS:  Current Facility-Administered Medications  Medication Dose Route Frequency Provider Last Rate Last Dose  . acetaminophen (TYLENOL) tablet 650 mg  650 mg Oral Q6H PRN Lytle Butte, MD      . antiseptic oral rinse (CPC / CETYLPYRIDINIUM CHLORIDE 0.05%) solution 7 mL  7 mL Mouth Rinse BID Lytle Butte, MD   7 mL at 08/22/15 0315  . bisacodyl (DULCOLAX) suppository 10 mg  10 mg Rectal Daily PRN Colleen Can, MD      . ipratropium-albuterol (DUONEB) 0.5-2.5 (3) MG/3ML nebulizer solution 3 mL  3 mL Nebulization Q4H PRN Lytle Butte, MD      . LORazepam (ATIVAN) injection 0.5 mg  0.5 mg Intravenous Q4H PRN Colleen Can, MD      . morphine 2  MG/ML injection 1 mg  1 mg Intravenous Q2H PRN Lytle Butte, MD   1 mg at 08/22/15 3785  . morphine CONCENTRATE 10 MG/0.5ML oral solution 5 mg  5 mg Sublingual Q2H PRN Lytle Butte, MD      . ondansetron [facility]) injection 4 mg  4 mg Intravenous Q6H PRN Lytle Butte, MD        Vital Signs: BP 170/106 mmHg  Pulse 106  Temp(Src) 98.1 F (36.7 C) (Oral)  Resp 20  Ht '5\' 9"'$  (1.753 m)  Wt 63.504 kg (140 lb)  BMI 20.67 kg/m2  SpO2 94% Filed Weights   08/18/15 2001  Weight: 63.504 kg (140 lb)    Estimated body mass index is 20.67 kg/(m^2) as calculated from the following:   Height as of this encounter: '5\' 9"'$  (1.753 m).   Weight as of this encounter: 63.504 kg (140 lb).  PERFORMANCE STATUS (ECOG) : 4 - Bedbound  PHYSICAL EXAM: Lying in medical bed --breathing through mouth quietly NAD at the moment, but it was reported that he had pain in his lungs recently Eyes closed Op dry Neck w/o JVD or TM Heart rrr no mgr Lungs with decreased BS bases Abd soft nl BS Ext no mottling or cyanosis as yet.  LABS: CBC:    Component Value Date/Time   WBC 11.0* 08/19/2015 1552   WBC 9.7 03/17/2014 1202   HGB 13.6 08/19/2015 1552   HGB 12.2* 03/17/2014 1202   HCT 40.9 08/19/2015 1552   HCT 37.3* 03/17/2014 1202   PLT 252 08/19/2015 1552   PLT 218 03/17/2014 1202   MCV 98.5 08/19/2015 1552   MCV 101* 03/17/2014 1202   NEUTROABS 5.0 07/20/2015 0958   NEUTROABS 5.7 03/17/2014 1202   LYMPHSABS 1.7 07/20/2015 0958   LYMPHSABS 2.6 03/17/2014 1202   MONOABS 0.6 07/20/2015 0958   MONOABS 0.9 03/17/2014 1202   EOSABS 0.2 07/20/2015 0958   EOSABS 0.5 03/17/2014 1202   BASOSABS 0.0 07/20/2015 0958   BASOSABS 0.0 03/17/2014  1202   Comprehensive Metabolic Panel:    Component Value Date/Time   NA 140 08/18/2015 2007   NA 141 03/17/2014 1202   K 3.9 08/18/2015 2007   K 4.1 03/17/2014 1202   CL 106 08/18/2015 2007   CL 104 03/17/2014 1202   CO2 30 08/18/2015 2007   CO2 32 03/17/2014 1202    BUN 39* 08/18/2015 2007   BUN 35* 03/17/2014 1202   CREATININE 1.58* 08/19/2015 1552   CREATININE 1.54* 03/17/2014 1202   GLUCOSE 135* 08/18/2015 2007   GLUCOSE 129* 03/17/2014 1202   CALCIUM 9.9 08/18/2015 2007   CALCIUM 9.5 03/17/2014 1202   AST 35 08/18/2015 2007   AST 32 03/17/2014 1202   ALT 40 08/18/2015 2007   ALT 24 03/17/2014 1202   ALKPHOS 80 08/18/2015 2007   ALKPHOS 81 03/17/2014 1202   BILITOT 0.5 08/18/2015 2007   BILITOT 0.3 03/17/2014 1202   PROT 6.5 08/18/2015 2007   PROT 7.1 03/17/2014 1202   ALBUMIN 4.1 08/18/2015 2007   ALBUMIN 3.7 03/17/2014 1202    More than 50% of the visit was spent in counseling/coordination of care: Yes  Time Spent: 120 minutes

## 2015-08-22 NOTE — Progress Notes (Signed)
New hospice home referral received from [facility]. Mr. Daniel Huff is an 82 year old man admitted to [facility] on 4/14 for evaluation of metabolic encephalopathy. Patient has a PMH of: Copd, BPH, cardiomyopathy, DJD, ventricular tachycardia with pacemaker in place. A spiculated mass was found on CT in January which is suspicious for malignancy, he has been seen at the cancer center and had a PET scan scheduled but it has not been completed. During this hospitalization he had an episode of acute respiratory failure and required Bipap, he is currently on room air. Patient remains lethargic/nonresponsive to voice or touch. Dr. Megan Salon and attending Dr. Anselm Jungling have spoken with family and with patient's DSS guardian and the plan is for patient to transfer to the hospice home on Tuesday 4/19 for end of life care. Hospital care team all aware of and in agreement with plan. Signed DNR in place on patient chart. Patient information faxed to referral. Consents faxed and signed by DSS supervisor. Writer to call report and arrange EMS transport tomorrow morning. Thank you for the opportunity to be involved in the care of this patient.  Flo Shanks RN, BSN, [place] and [facility] [place], [facility] [phone] c

## 2015-08-22 NOTE — Care Management (Addendum)
Met with patient's daughter, son in law, and patient's wife to provide hospice options. Son in law is concerned that patient cannot transfer to hospice home without dying- he states he's really surprised that he's "still hanging on". IF he can transfer they have selected [facility]. I have notified Santiago Glad with [facility] hospice that family would like to talk to her. Dr. Megan Salon is currently on this unit. DSS guardian showed up and discussed discharge transition with Dr. Megan Salon. Patient has a legal guardianRadio producer of Social Services Eduard Roux [phone] Hopping stated that "no one spoke with them prior to making patient a DNR however I do agree with DNR status" and wanted to see if patient could go back to family care home with hospice. DR. Megan Salon and Jarrett Soho CSW mentioned that patient may be more care than that level of care can provide yet SNF was not appropriate either. Dr. Megan Salon stated that hospice home is most appropriate. Santiago Glad with [facility] [place] will have a bed tomorrow morning for patient transfer. 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 I have faxed H & P to [facility].

## 2015-08-22 NOTE — Plan of Care (Signed)
Pt being transferred to 1C - Rm 105.  Report called to Merry Proud, RN.

## 2015-08-22 NOTE — Progress Notes (Addendum)
Palliative Care Update  I am seeing pt this am for end of life comfort care. I was consulted formally on Saturday (and of course, am not here on weekends) --and yesterday was updated on pt and discussed pt with care manager -- but I was unable to do full consult last evening.   I have just talked with pts family and legal guardian.  Legal guardian, Eduard Roux, came here personally to deliver guardianship papers and to get an update and I happened to be talking with care mgr when she came up to talk about pt.    She says they have to get formal approval for the DNR already ordered.  I have asked that this be expedited immediately. They will call me when this is approved.  She will also consider my recommendation that pt go to [facility] since pt has need for skilled nursing to assess his pain and symptoms and deliver narcotic pain meds etc on a prn basis.   Dr. Anselm Jungling was present for most of this discussion with legal guardian as well.  Will review current comfort care orders and work on discharge med recs.  Discussed with care mgr and attending.  Family a bit confused about the list of Hospice organizations that they showed me --and wanted to go over with me.  I spent some time educating them about Hospices, Hospice Home, and inpatient comfort care (the latter being suitable for pts who are unstable for transfer).  But, we wait on guardian approval for DNR and for Hospice Home.   Pts vital signs were done and show a high blood pressure.    See full note to follow.  Colleen Can, MD  Colleen Can, MD

## 2015-09-04 NOTE — Progress Notes (Signed)
Follow visit made to new hospice home referral. Patient seen lying in bed, very minimally responsive to touch or voice. Daughter Pam at bedside. Plan remains for patient to be transported via EMS today to the hospice home. He has required 1 dose of PRN liquid morphine overnight, he remains on 2 liters of oxygen via nasal cannula and is receiving '5mg'$  of scheduled liquid morphine q 8 hours for treatment of pain/dyspnea. Report called to the hospice home, EMS notified for transport. Hospital care team all aware.Signed [portable DNR in place in discharge packet. Writer contacted patient's DSS guardian Levander Campion [phone]) to notify of transport. Thank you. Flo Shanks RN, BSN, [facility] liaison [phone]

## 2015-09-04 NOTE — Care Management Important Message (Signed)
Important Message  Patient Details  Name: Wake Conlee MRN: [id number] Date of Birth: 11/30/1926   Medicare Important Message Given:  Yes    Juliann Pulse A Liban Guedes 2015-09-11, 10:26 AM

## 2015-09-04 NOTE — Discharge Summary (Signed)
[facility] NAME: Daniel Huff    MR#:  [id number]  DATE OF BIRTH:  03-15-1927  DATE OF ADMISSION:  08/18/2015 ADMITTING PHYSICIAN: Lytle Butte, MD  DATE OF DISCHARGE: Sep 06, 2015  PRIMARY CARE PHYSICIAN: REID,INDIA, MD    ADMISSION DIAGNOSIS:  Mild dehydration [E86.0] Altered mental status, unspecified altered mental status type [R41.82]  DISCHARGE DIAGNOSIS:  Active Problems:   Metabolic encephalopathy   Acute respiratory failure with hypoxia ([place])   SECONDARY DIAGNOSIS:   Past Medical History  Diagnosis Date  . PVD (peripheral vascular disease) ([place])   . Hypertension   . DJD (degenerative joint disease)   . Ventricular tachycardia (paroxysmal) ([place])   . BPH (benign prostatic hyperplasia)   . Cardiomyopathy [facility])     HOSPITAL COURSE:   1. Acute on chronic respiratory failure with hypoxia: in the setting of COPD exacerbation, lung cancer.   Now on comfort care, plan for hospice.  Ch Diastolic heart failure also present, no exacerbation. 2. Metabolic encephalopathy 3. essential hypertension 4.CAD  Transfer to hospice home today as per discussion yesterday.  DISCHARGE CONDITIONS:   Stable.  CONSULTS OBTAINED:  Treatment Team:  Lytle Butte, MD  DRUG ALLERGIES:   Allergies  Allergen Reactions  . Aspirin Other (See Comments)    Reaction:  Unknown     DISCHARGE MEDICATIONS:   Current Discharge Medication List    START taking these medications   Details  bisacodyl (DULCOLAX) 10 MG suppository Place 1 suppository (10 mg total) rectally daily as needed for moderate constipation. Qty: 6 suppository, Refills: 0    ipratropium-albuterol (DUONEB) 0.5-2.5 (3) MG/3ML SOLN Take 3 mLs by nebulization every 2 (two) hours as needed. Qty: 30 mL, Refills: 0    LORazepam (ATIVAN) 0.5 MG tablet Take 1 tablet (0.5 mg total) by mouth every 4 (four) hours as needed for anxiety or sleep. Qty: 15 tablet,  Refills: 0    Morphine Sulfate (MORPHINE CONCENTRATE) 10 MG/0.5ML SOLN concentrated solution Place 0.25 mLs (5 mg total) under the tongue every 2 (two) hours as needed for moderate pain, severe pain or shortness of breath. Qty: 30 mL, Refills: 0    prochlorperazine (COMPAZINE) 25 MG suppository Place 1 suppository (25 mg total) rectally every 12 (twelve) hours as needed for nausea or vomiting. Qty: 6 suppository, Refills: 0      STOP taking these medications     acetaminophen (TYLENOL) 325 MG tablet      albuterol (PROVENTIL HFA;VENTOLIN HFA) 108 (90 Base) MCG/ACT inhaler      allopurinol (ZYLOPRIM) 100 MG tablet      Calcium Carbonate-Vitamin D (CALCIUM 600+D) 600-400 MG-UNIT per tablet      diclofenac sodium (VOLTAREN) 1 % GEL      ENSURE (ENSURE)      finasteride (PROSCAR) 5 MG tablet      Ipratropium-Albuterol (COMBIVENT RESPIMAT) 20-100 MCG/ACT AERS respimat      magnesium oxide (MAG-OX) 400 MG tablet      Melatonin 3 MG TABS      metoprolol succinate (TOPROL-XL) 12.5 mg TB24 24 hr tablet      oxyCODONE (OXY IR/ROXICODONE) 5 MG immediate release tablet      senna-docusate (SENOKOT-S) 8.6-50 MG tablet      simvastatin (ZOCOR) 5 MG tablet      triamcinolone (KENALOG) 0.025 % cream      warfarin (COUMADIN) 1 MG tablet          DISCHARGE INSTRUCTIONS:  Follow with Hospice nurse as needed.  If you experience worsening of your admission symptoms, develop shortness of breath, life threatening emergency, suicidal or homicidal thoughts you must seek medical attention immediately by calling 911 or calling your MD immediately  if symptoms less severe.  You Must read complete instructions/literature along with all the possible adverse reactions/side effects for all the Medicines you take and that have been prescribed to you. Take any new Medicines after you have completely understood and accept all the possible adverse reactions/side effects.   Please note  You  were cared for by a hospitalist during your hospital stay. If you have any questions about your discharge medications or the care you received while you were in the hospital after you are discharged, you can call the unit and asked to speak with the hospitalist on call if the hospitalist that took care of you is not available. Once you are discharged, your primary care physician will handle any further medical issues. Please note that NO REFILLS for any discharge medications will be authorized once you are discharged, as it is imperative that you return to your primary care physician (or establish a relationship with a primary care physician if you do not have one) for your aftercare needs so that they can reassess your need for medications and monitor your lab values.    Today   CHIEF COMPLAINT:   Chief Complaint  Patient presents with  . Altered Mental Status    HISTORY OF PRESENT ILLNESS:  Daniel Huff  is a 82 y.o. male with a known history of essential hypertension, pacemaker presenting with altered mental status. The patient is unable to provide meaningful information given mental status. Sent from group home due to "slow responses" ED workup fairly unrevealing, however mental status remains an issue. Had episodes of apnea and placed on CPAP overnight. Once again patient unable to provide information given mental status  VITAL SIGNS:  Blood pressure 136/64, pulse 107, temperature 98 F (36.7 C), temperature source Oral, resp. rate 17, height '5\' 9"'$  (1.753 m), weight 63.504 kg (140 lb), SpO2 85 %.  I/O:  No intake or output data in the 24 hours ending 2015/09/19 1033  PHYSICAL EXAMINATION:   GENERAL:82 y.o.male moderate distress given mental status.  HEAD: Normocephalic, atraumatic.  EYES: Pupils equal, round, reactive to light. Unable to assess extraocular muscles given mental status/medical condition. No scleral icterus.  MOUTH: Moist mucosal membrane. Dentition intact. No abscess  noted.  EAR, NOSE, THROAT: Clear without exudates. No external lesions.  NECK: Supple. No thyromegaly. No nodules. No JVD.  PULMONARY: coarse breath sounds without wheeze rails or rhonci. No use of accessory muscles, good air entry bilaterally  shallow breaths. CHEST: Nontender to palpation.  CARDIOVASCULAR: S1 and S2. Regular rate and rhythm. No murmurs, No edema. Pedal pulses 2+ bilaterally.  GASTROINTESTINAL: Soft, nontender, nondistended. No masses. Positive bowel sounds. No hepatosplenomegaly.  MUSCULOSKELETAL: No swelling, clubbing, or edema. Range of motion full in all extremities.  NEUROLOGIC: Unable to assess given mental status/medical condition SKIN: No ulceration, lesions, rashes, or cyanosis. Skin warm and dry. Turgor intact.  PSYCHIATRIC: Unable to assess given mental status/medical condition    DATA REVIEW:   CBC  Recent Labs Lab 08/19/15 1552  WBC 11.0*  HGB 13.6  HCT 40.9  PLT 252    Chemistries   Recent Labs Lab 08/18/15 2007 08/19/15 1552  NA 140  --   K 3.9  --   CL 106  --  CO2 30  --   GLUCOSE 135*  --   BUN 39*  --   CREATININE 1.49* 1.58*  CALCIUM 9.9  --   AST 35  --   ALT 40  --   ALKPHOS 80  --   BILITOT 0.5  --     Cardiac Enzymes No results for input(s): TROPONINI in the last 168 hours.  Microbiology Results  Results for orders placed or performed during the hospital encounter of 08/18/15  MRSA PCR Screening     Status: None   Collection Time: 08/19/15  8:15 AM  Result Value Ref Range Status   MRSA by PCR NEGATIVE NEGATIVE Final    Comment:        The GeneXpert MRSA Assay (FDA approved for NASAL specimens only), is one component of a comprehensive MRSA colonization surveillance program. It is not intended to diagnose MRSA infection nor to guide or monitor treatment for MRSA infections.     RADIOLOGY:  No results found.  Management plans discussed with the patient, family and they are in agreement.  CODE  STATUS:     Code Status Orders        Start     Ordered   08/22/15 1457  Do not attempt resuscitation (DNR)   Continuous    Question Answer Comment  In the event of cardiac or respiratory ARREST Do not call a "code blue"   In the event of cardiac or respiratory ARREST Do not perform Intubation, CPR, defibrillation or ACLS   In the event of cardiac or respiratory ARREST Use medication by any route, position, wound care, and other measures to relive pain and suffering. May use oxygen, suction and manual treatment of airway obstruction as needed for comfort.   Comments dnr dni, no Hi flow or bipap.  Pt is comfort care and his GUARDIAN has approved this plan      08/22/15 1456    Code Status History    Date Active Date Inactive Code Status Order ID Comments User Context   08/22/2015 12:44 PM 08/22/2015  2:16 PM Full Code [id number]  Colleen Can, MD Inpatient   08/19/2015 12:43 PM 08/22/2015 12:43 PM DNR [id number]  Lytle Butte, MD ED   08/19/2015 12:41 PM 08/19/2015 12:43 PM DNR [id number]  Lytle Butte, MD ED   08/19/2015  7:35 AM 08/19/2015 12:41 PM Full Code [id number]  Lytle Butte, MD ED   06/01/2015  2:55 AM 06/06/2015  6:39 PM Full Code [id number]  Lance Coon, MD ED      TOTAL TIME TAKING CARE OF THIS PATIENT: 35 minutes.    Vaughan Basta M.D on August 29, 2015 at 10:33 AM  Between 7am to 6pm - Pager - [phone]  After 6pm go to [email or web address] - password [email or web address] [facility]  Office  [phone]  CC: Primary care physician; Hilbert Corrigan, MD   Note: This dictation was prepared with Dragon dictation along with smaller phrase technology. Any transcriptional errors that result from this process are unintentional.

## 2015-09-04 NOTE — Clinical Social Work Note (Signed)
Pt is ready for discharge and will go to [facility] today. CSW prepared packet. Report has been called. [facility] will provide transportation. CSW spoke with Levander Campion with [facility]. CSW updated guardian regarding transfer and they are agreeable with discharge plan. CSW is signing off as no further needs identified.   Darden Dates, MSW, LCSW  Clinical Social Worker  [phone]

## 2015-09-04 DEATH — deceased

## 2016-11-08 IMAGING — CT CT ANGIO CHEST
1 of 2 series · 18 of 30 positions shown · IV contrast (APPLIED)
Comparison: 05/31/2015

CLINICAL DATA: Productive cough with white/yellow phlegm, shortness
of breath, feels like something is caught in his throat, history
hypertension, cardiomyopathy, former smoker

EXAM:
CT ANGIOGRAPHY CHEST WITH CONTRAST
TECHNIQUE: Multidetector CT imaging of the chest was performed using the
standard protocol during bolus administration of intravenous
contrast. Multiplanar CT image reconstructions and MIPs were
obtained to evaluate the vascular anatomy.
CONTRAST:  60mL OMNIPAQUE IOHEXOL 350 MG/ML SOLN IV

[Series 6: pe 1.0 thins · axial · 0.74mm/px · z∈[-68,+180]mm · 18 of 279 slices shown]
[im 16/279  lung]
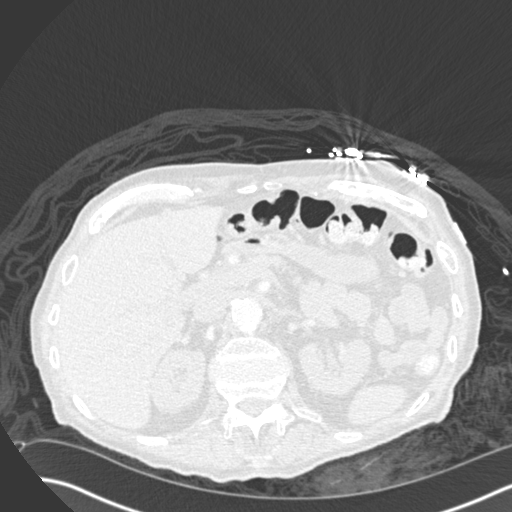
[im 31/279  mediastinal]
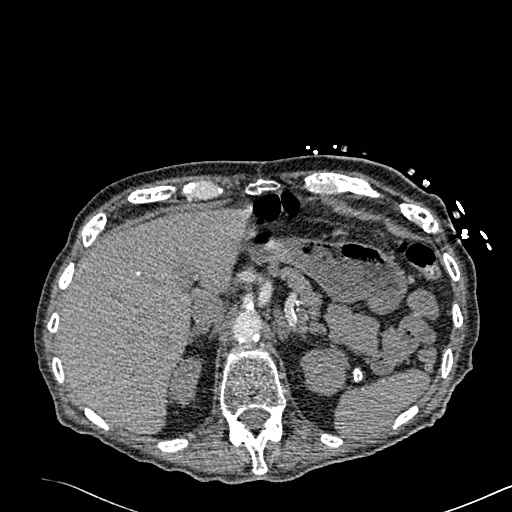
[im 47/279  lung]
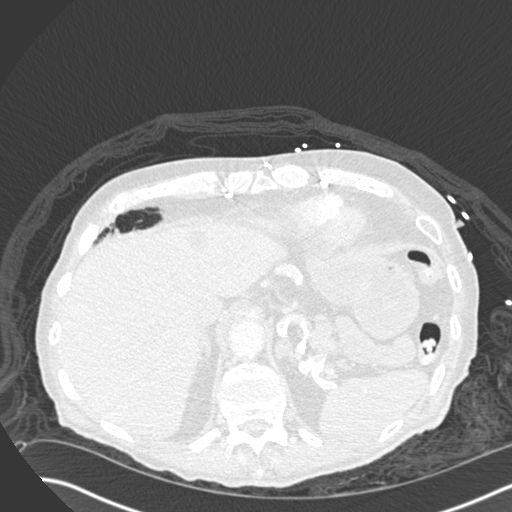
[im 62/279  mediastinal]
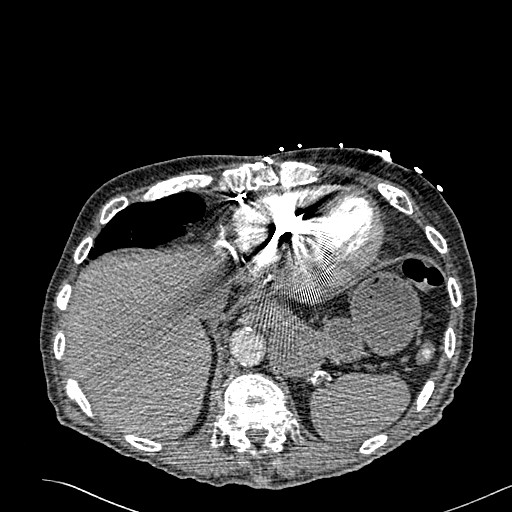
[im 78/279  lung]
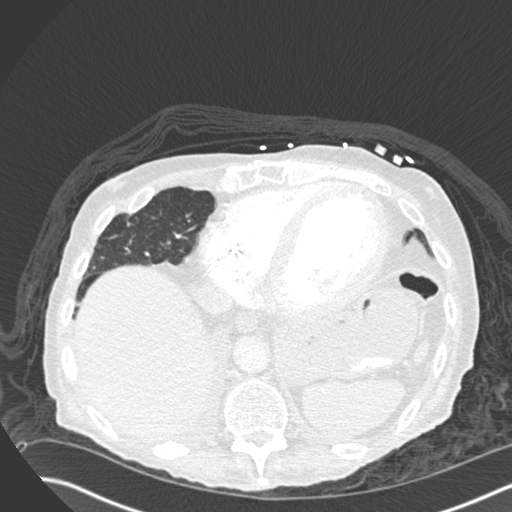
[im 93/279  mediastinal]
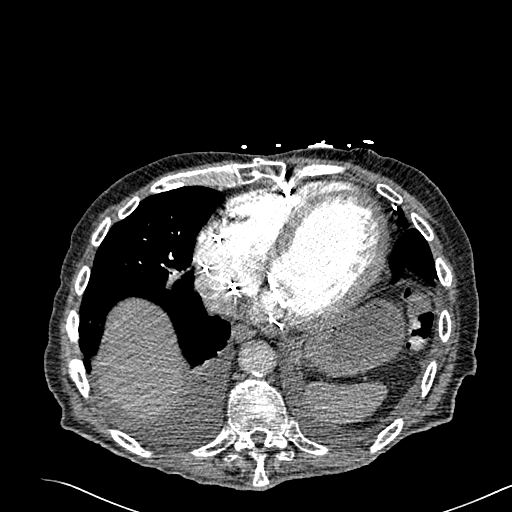
[im 109/279  lung]
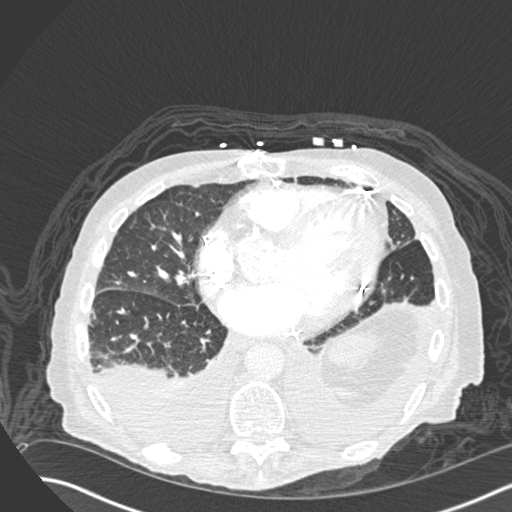
[im 124/279  mediastinal]
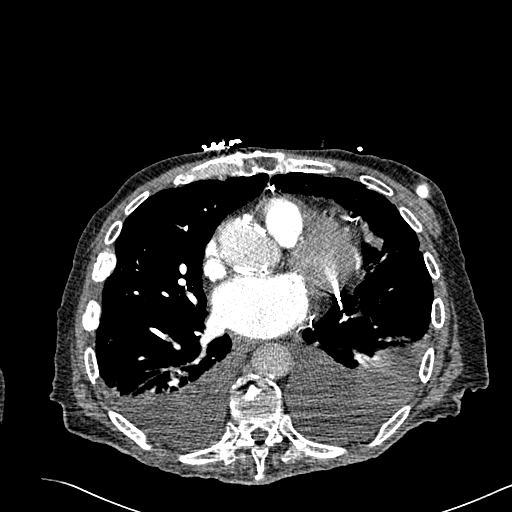
[im 130/279  lung]
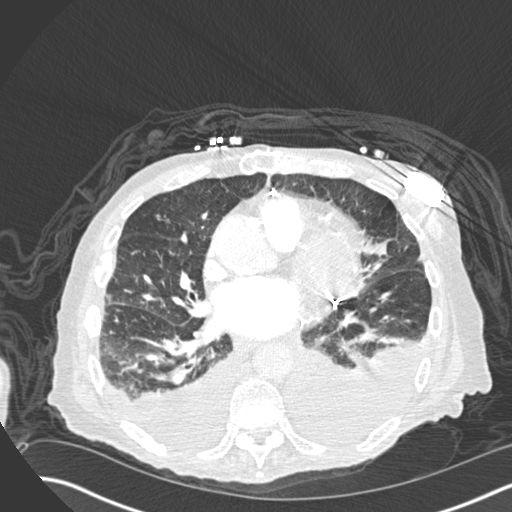
[im 140/279  mediastinal]
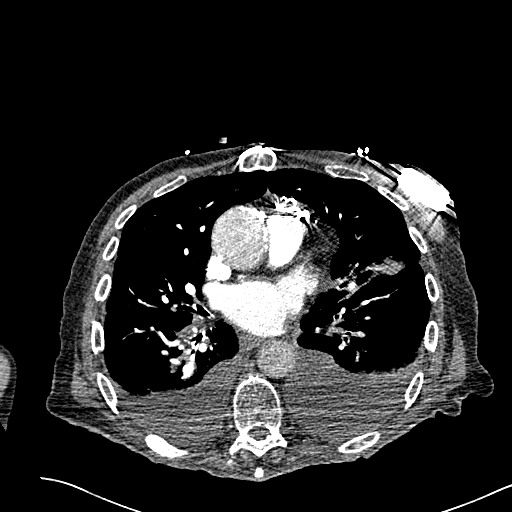
[im 155/279  lung]
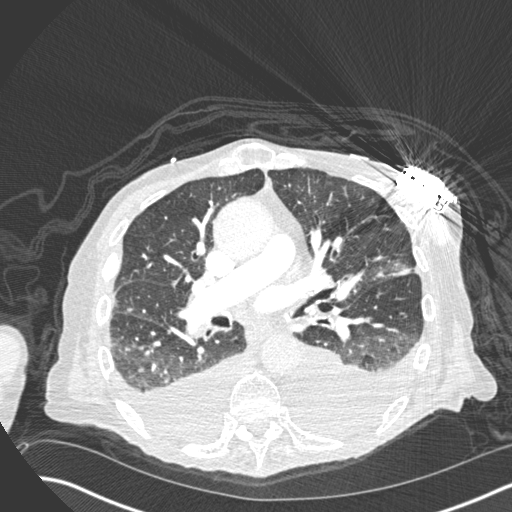
[im 170/279  mediastinal]
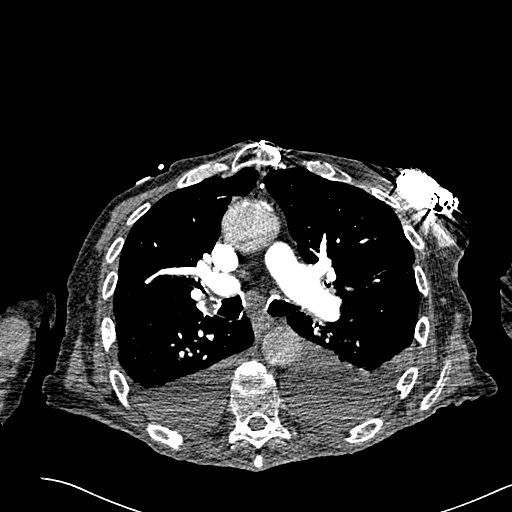
[im 186/279  lung]
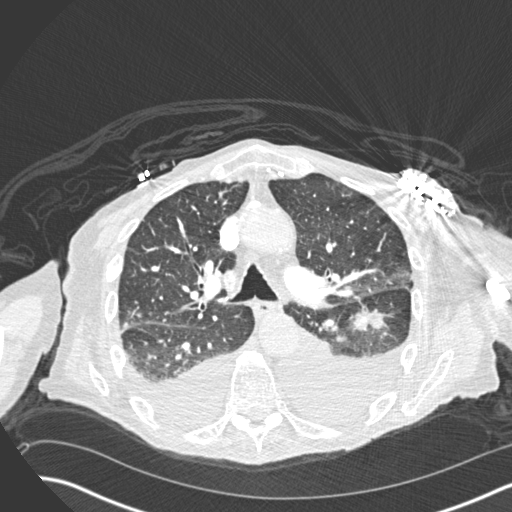
[im 201/279  mediastinal]
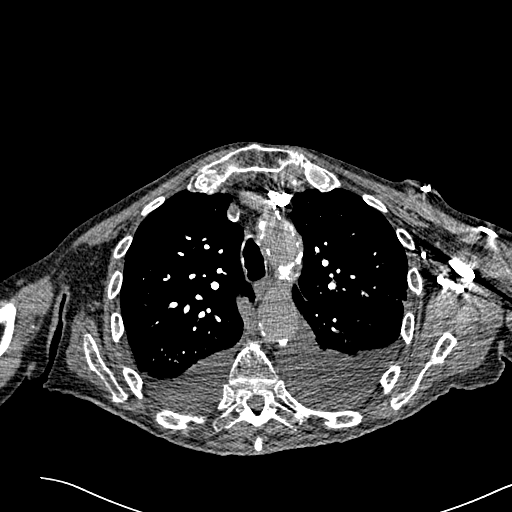
[im 217/279  lung]
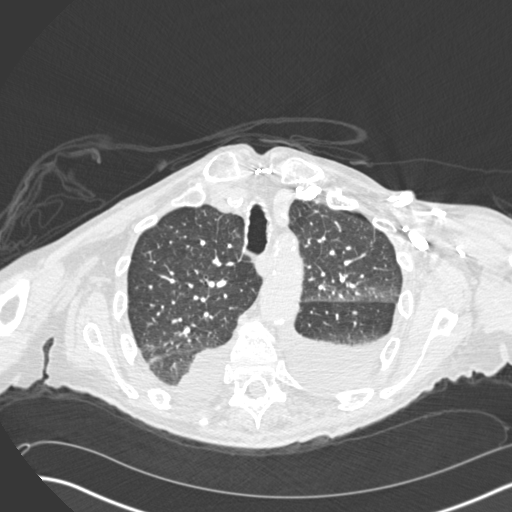
[im 232/279  mediastinal]
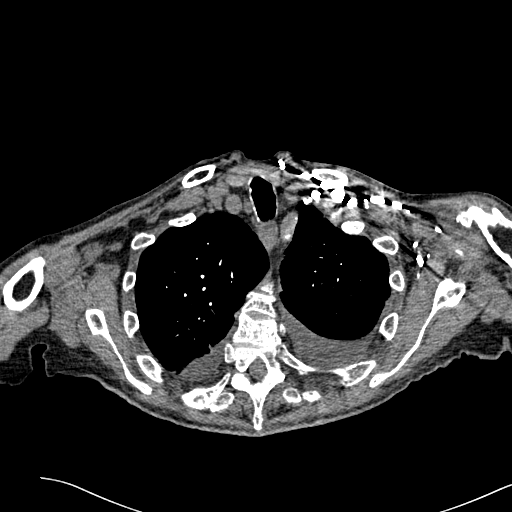
[im 248/279  lung]
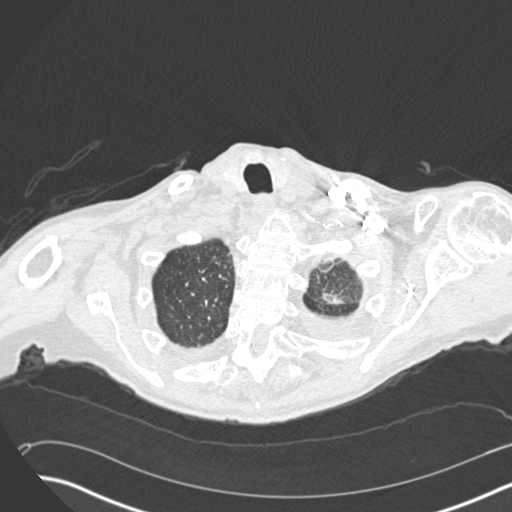
[im 263/279  mediastinal]
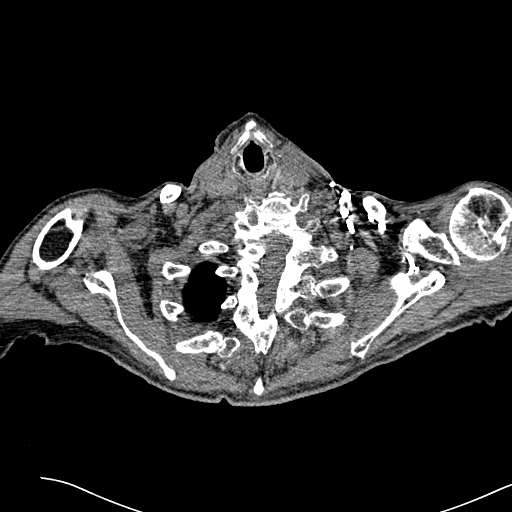

[18 of 30 positions shown; findings below may reference images not displayed]

FINDINGS: Extensive atherosclerotic calcifications aorta, coronary arteries,
proximal great vessels.

Aneurysmal dilatation ascending thoracic aorta 4.4 cm diameter.

Pulmonary arteries well opacified and patent.

No evidence of pulmonary embolism.

Pacemaker leads RIGHT atrium and RIGHT ventricle.

Dilatation of cardiac chambers.

15 mm short axis subcarinal node slightly increased.

Additional scattered normal sized mediastinal and hilar nodes.

Probable LEFT renal cyst 17 x 16 mm image 140.

Remaining visualized upper abdomen normal.

Moderate pleural effusions bilaterally greater on LEFT.

Compressive atelectasis of posterior lower lobes bilaterally.

Persisted irregular spiculated density within the superior segment
LEFT lower lobe concerning for tumor 2.3 x 1.6 x 1.7 cm image 45.

Central peribronchial thickening scattered atelectasis bilaterally.

Prior median sternotomy.

No definite additional mass/nodule.

Multiple thoracic spine compression fractures and osseous
demineralization.

Old posterior RIGHT rib fractures.

Review of the MIP images confirms the above findings.
IMPRESSION: No evidence of pulmonary embolism.

Moderate pleural effusions scattered atelectasis in both lungs.

Spiculated mass superior segment LEFT lower lobe 2.3 x 1.6 x 1.7 cm
suspicious for pulmonary malignancy ; recommend nonemergent PET-CT
imaging or tissue diagnosis.

Single mildly enlarged subcarinal lymph node.

Multiple thoracic spine compression fractures and osseous
demineralization.

Extensive atherosclerotic changes with aneurysmal dilatation of the
ascending aorta up to 4.4 cm diameter.

Recommend annual imaging followup by CTA or MRA. This recommendation
follows 1494 ACCF/AHA/AATS/ACR/ASA/SCA/LANDEN/IROLF/NEENA/BARNEY Guidelines
for the Diagnosis and Management of Patients with Thoracic Aortic
Disease. Circulation. 1494; 121: [id number]
